# Patient Record
Sex: Female | Born: 1975 | Race: White | Hispanic: No | Marital: Married | Smoking: Never smoker
Health system: Southern US, Community
[De-identification: ages and names within clinical notes are randomized; demographics above are authoritative.]

## PROBLEM LIST (undated history)

## (undated) DIAGNOSIS — F32A Depression, unspecified: Secondary | ICD-10-CM

## (undated) DIAGNOSIS — G4700 Insomnia, unspecified: Secondary | ICD-10-CM

## (undated) DIAGNOSIS — Z8632 Personal history of gestational diabetes: Secondary | ICD-10-CM

## (undated) DIAGNOSIS — Z8781 Personal history of (healed) traumatic fracture: Secondary | ICD-10-CM

## (undated) DIAGNOSIS — N200 Calculus of kidney: Secondary | ICD-10-CM

## (undated) DIAGNOSIS — F419 Anxiety disorder, unspecified: Secondary | ICD-10-CM

## (undated) DIAGNOSIS — F329 Major depressive disorder, single episode, unspecified: Secondary | ICD-10-CM

## (undated) DIAGNOSIS — F909 Attention-deficit hyperactivity disorder, unspecified type: Secondary | ICD-10-CM

## (undated) DIAGNOSIS — O00109 Unspecified tubal pregnancy without intrauterine pregnancy: Secondary | ICD-10-CM

## (undated) DIAGNOSIS — E039 Hypothyroidism, unspecified: Secondary | ICD-10-CM

## (undated) DIAGNOSIS — R7302 Impaired glucose tolerance (oral): Secondary | ICD-10-CM

## (undated) HISTORY — DX: Personal history of (healed) traumatic fracture: Z87.81

## (undated) HISTORY — DX: Insomnia, unspecified: G47.00

## (undated) HISTORY — DX: Impaired glucose tolerance (oral): R73.02

## (undated) HISTORY — PX: ABLATION: SHX5711

## (undated) HISTORY — DX: Major depressive disorder, single episode, unspecified: F32.9

## (undated) HISTORY — DX: Hypothyroidism, unspecified: E03.9

## (undated) HISTORY — DX: Calculus of kidney: N20.0

## (undated) HISTORY — DX: Unspecified tubal pregnancy without intrauterine pregnancy: O00.109

## (undated) HISTORY — DX: Personal history of gestational diabetes: Z86.32

## (undated) HISTORY — PX: LAPAROSCOPIC CHOLECYSTECTOMY: SUR755

## (undated) HISTORY — DX: Attention-deficit hyperactivity disorder, unspecified type: F90.9

## (undated) HISTORY — DX: Anxiety disorder, unspecified: F41.9

## (undated) HISTORY — DX: Depression, unspecified: F32.A

---

## 1997-12-15 DIAGNOSIS — O00109 Unspecified tubal pregnancy without intrauterine pregnancy: Secondary | ICD-10-CM

## 1997-12-15 HISTORY — PX: OTHER SURGICAL HISTORY: SHX169

## 1997-12-15 HISTORY — DX: Unspecified tubal pregnancy without intrauterine pregnancy: O00.109

## 2004-01-26 ENCOUNTER — Ambulatory Visit (HOSPITAL_COMMUNITY): Admission: RE | Admit: 2004-01-26 | Discharge: 2004-01-26 | Payer: Self-pay | Admitting: Endocrinology

## 2005-08-26 ENCOUNTER — Ambulatory Visit: Payer: Self-pay | Admitting: Endocrinology

## 2005-09-19 ENCOUNTER — Ambulatory Visit: Payer: Self-pay | Admitting: Endocrinology

## 2007-03-10 ENCOUNTER — Ambulatory Visit: Payer: Self-pay | Admitting: Cardiology

## 2007-03-26 ENCOUNTER — Ambulatory Visit: Payer: Self-pay | Admitting: Cardiology

## 2007-04-08 ENCOUNTER — Ambulatory Visit: Payer: Self-pay | Admitting: Cardiology

## 2007-04-14 ENCOUNTER — Ambulatory Visit: Payer: Self-pay | Admitting: Cardiology

## 2007-05-19 ENCOUNTER — Ambulatory Visit: Payer: Self-pay | Admitting: Cardiology

## 2008-12-15 DIAGNOSIS — Z8632 Personal history of gestational diabetes: Secondary | ICD-10-CM

## 2008-12-15 HISTORY — DX: Personal history of gestational diabetes: Z86.32

## 2009-03-20 ENCOUNTER — Ambulatory Visit (HOSPITAL_COMMUNITY): Admission: RE | Admit: 2009-03-20 | Discharge: 2009-03-20 | Payer: Self-pay | Admitting: Pediatrics

## 2011-04-29 NOTE — Assessment & Plan Note (Signed)
[facility]                          [facility]   Karen, Arnold                       MRN:          [id number]  DATE:05/19/2007                            DOB:          January 20, 1976    REFERRING PHYSICIAN:  Fara Chute   [facility]   HISTORY OF PRESENT ILLNESS:  The patient is a 35 year old female with a  history of palpitations.  The patient also has been diagnosed with  hypertension.  The patient underwent a transesophageal echographic study  due to presumed abnormality on transthoracic echo.  She was found to  have lipomatous septal hypertrophy as a normal variant.  The patient  also has been ruled out for significant arrhythmias.  She does have  occasional PACs as documented by EKG.   States that overall, she is doing well.  Her blood pressure is under  better control with increasing her Cardizem.  However, the blood  pressure is still not optimal.   MEDICATIONS:  1. Zyrtec 10 mg p.o. daily.  2. Cardizem CD 240 mg p.o. daily.   PHYSICAL EXAMINATION:  VITAL SIGNS:  Blood pressure 128/87, heart rate  87, weight 275 pounds.  GENERAL:  Overweight white female with no apparent distress.  HEENT:  Pupils and eyes clear.  Conjunctivae clear.  NECK:  Supple.  Normal carotid upstroke.  No carotid bruits.  LUNGS:  Clear breath sounds bilaterally.  HEART:  Regular rate and rhythm.  Normal S1, S2.  No murmurs, rubs, or  gallops.  ABDOMEN:  Soft and nontender.  No rebound or guarding.  Normal bowel  sounds.  EXTREMITIES:  No cyanosis, clubbing, or edema.  NEURO:  The patient is alert and oriented, grossly nonfocal.   PROBLEM LIST:  1. Palpitations.      a.     Premature atrial contractions.      b.     Short PR interval with no definite evidence of Wolff-       Parkinson-White Syndrome.  2. Hypertension, poorly controlled.  3. Hypertensive heart disease.  4. Lipomatous septal hypertrophy.   PLAN:  1. The patient  needs better control of her blood pressure and I have      added hydrochlorothiazide.  2. Regarding the patient's palpitations, she has occasional PACs but      is otherwise      asymptomatic.  No additional therapy is warranted.  3. The patient will follow up with [place] in 6 months.     Learta Codding, MD,FACC  Electronically Signed    GED/MedQ  DD: 05/19/2007  DT: 05/19/2007  Job #: [id number]   cc:   Fara Chute

## 2011-05-02 NOTE — Assessment & Plan Note (Signed)
[facility]                          [facility]   Karen Arnold, Karen Arnold                       MRN:          [id number]  DATE:03/10/2007                            DOB:          11-24-1976    REASON FOR CONSULTATION:  Evaluation of cardiac arrhythmia.   HISTORY OF PRESENT ILLNESS:  The patient is a 35 year old female with a  history of longstanding palpitations.  The patient originally reported  palpitations 15 to 16 years ago.  Historically, the patient has been  taking Lanoxin, although she has not been taking this recently.  She  states that she saw several cardiologists in the past.  The most recent  cardiologist was 5 to 6 years ago.  She was seen in our [facility]  here in [place].  I do not have any records available, however, of that  encounter.  The patient states that she had an echocardiographic study  done at that time, which reportedly was within normal limits.  She now  reports 3 to 4 beats of irregular heart skipping and followed by a  pause, then she feels that the heart restarts with a forceful beat.  She feels that the sensation takes her breath away.  She denies having  any prolonged palpitations.  She denies any substernal chest pain or  syncope associated with the palpitations.  There are no definite  aggravating factors.  As a matter of fact, the patient denies any  tobacco, alcohol, or excessive caffeine intake.  She also denies any  drug use.  The patient has been referred for a questionable diagnosis of  WPW.  Review of her EKG does demonstrate a short PR interval, and there  is a small delta wave seen in lead 2.  Possibility of accessory pathway  is not excluded.  The patient reports normal exercise tolerance.  The  remainder of her cardiovascular review of systems is within normal  limits.   ALLERGIES:  NO KNOWN DRUG ALLERGIES.   SOCIAL HISTORY:  The patient is a full time Consulting civil engineer.   MEDICATIONS:  Zyrtec  10 mg p.o. daily.   FAMILY HISTORY:  Father had 1st heart attack at age 22.  Mother is age  80 and alive.  Father is age 6 now, and alive.   REVIEW OF SYSTEMS:  As per HPI.  No nausea or vomiting.  No fever or  chills.  No nausea, no hematochezia.  No dysuria or frequency.  No  orthopnea or PND.  No palpitations or syncope.   PHYSICAL EXAMINATION:  VITAL SIGNS:  Blood pressure is 143/93 mmHg.  On  repeat workup, it was 139/92 mm of mercury.  Heart rate is 102 beats per  minute.  On EKG it is 91 beats per minute.  GENERAL:  An overweight white female, in no apparent distress.  HEENT:  Pupils:  Eyes are __________.  NECK:  Supple.  No carotid upstroke.  No carotid bruits.  LUNGS:  Clear breath sounds bilaterally.  HEART:  Regular rate and rhythm.  Normal S1 and S2.  No murmurs, rubs,  or gallops.  ABDOMEN:  Soft and non-tender.  No rebound or guarding.  Good bowel  sounds.  EXTREMITY EXAM:  No cyanosis, clubbing or edema.  NEUROLOGIC:  Patient alert and oriented.  Grossly nonfocal.   PROBLEM LIST:  1. Palpitations.  2. Rule out WPW.  Short RP interval.  3. Rule out atrioventricular nodal reentrant tachycardia versus AV      reentry tachycardia.  4. Rule out sleep apnea.  5. Rule out hypertension heart disease (blood pressure poorly      controlled).   PLAN:  1. The patient will have repeat echocardiographic study to rule out      structural heart disease.  In light of her blood pressure and EKG,      the patient could have hypertensive heart disease.  2. Diagnosis WPW is not excluded.  The patient surely does have a      short PR interval on EKG, but the diagnosis is not definitive.  3. A CardioNet monitor will be applied to rule out any underlying      arrhythmias associated with number 2.  4. I started the patient on Cardizem CD 120 mg p.o. daily in light of      her elevated blood pressure.  The patient will follow up with me at      the end of monitoring.  We will decide  on any further testing at      that time.     Learta Codding, MD,FACC  Electronically Signed    GED/MedQ  DD: 03/10/2007  DT: 03/10/2007  Job #: [id number]   cc:   Rosanna Randy

## 2011-05-02 NOTE — Assessment & Plan Note (Signed)
[facility]                          [facility]   Karen Arnold, Karen Arnold                       MRN:          [id number]  DATE:04/08/2007                            DOB:          07-06-1976    HISTORY OF PRESENT ILLNESS:  The patient is a 35 year old female with a  longstanding history of palpitations.  She was seen in consultation on  March 10, 2007.  Review of the EKG did demonstrate a short PR interval  but diagnostically we were not certain whether this patient had WPW.  An  event monitor was applied.  The patient activated the monitor on several  occasions with palpitations, but each time sinus tachycardia was  demonstrated.  No significant arrhythmias were documented.  An  echocardiographic study was also done to rule out structural heart  disease and this demonstrated normal LV function and no significant  valvular lesions.  Dr. Diona Browner interpreted the study, did report a  small echodensity around the posterior mitral annulus at the level of  the basal intraatrial septum, the significance of this finding was  unclear.  In the interim, the patient has been started on Cardizem  mainly for blood pressure control but also in an attempt to control  palpitations which I feel are most likely secondary to premature atrial  contractions.  The patient stated that overall she has been doing well,  she has very rare palpitations and certainly has no specific discomfort  or complications with this.  Dr. Neita Carp has already further increased  the patient's Cardizem to 240 mg a day in order to control her blood  pressure.  The diastolic blood pressure in the office today is slightly  improved to 89 but certainly not optimal.   MEDICATIONS:  1. Zyrtec 10 mg p.o. daily.  2. Cardizem CD 240 mg p.o. daily.   PHYSICAL EXAMINATION:  VITAL SIGNS:  Blood pressure 135/89, heart rate  87 b.p.m., weight is 268 pounds.  NECK EXAM:  Normal carotid  upstroke, no carotid bruits.  LUNGS:  Clear.  HEART:  Regular rate and rhythm.  ABDOMEN:  Soft.  EXTREMITY EXAM:  No edema.   PROBLEM LIST:  1. Palpitations.      a.     Rule out premature atrial contractions.      b.     Rule out WPW with short PR interval (no evidence of reentry       arrhythmias with event monitor.  2. Hypertension, poorly controlled.  3. Rule out sleep apnea.  4. Rule out hypertensive heart disease.  5. Questionable echodensity on the posterior mitral annulus.   PLAN:  1. From a hypertensive perspective, the patient has improved.  Her      diastolic blood pressure is better but not optimal.  I would      anticipate that the patient will need the addition of      hydrochlorothiazide in near future but will defer this to Dr.      Neita Carp.  2. We will further evaluate the finding of echodensity at the level of  the posterior mitral annulus as interpreted on the echocardiogram      by Dr. Diona Browner.  I am not certain as to the significance, but the      patient wants to have this further evaluated and we will proceed      with a transesophageal echocardiographic study.  3. In terms of patient's palpitations, this appears to be relatively      benign and no significant arrhythmias have been documented.  The      patient is comfortable with just continuing her Cardizem for now      and not to change therapy.     Learta Codding, MD,FACC     GED/MedQ  DD: 04/08/2007  DT: 04/08/2007  Job #: [id number]   cc:   Fara Chute                             [facility]                          [facility]   NAME:Karen Arnold, Karen Arnold                       MRN:          [id number]  DATE:04/08/2007                            DOB:          10-Sep-1976    HISTORY OF PRESENT ILLNESS:  The patient is a 35 year old female with a  longstanding history of palpitations.  She was seen in consultation on  March 10, 2007.  Review of the EKG did demonstrate a short  PR interval  but diagnostically we were not certain whether this patient had WPW.  An  event monitor was applied.  The patient activated the monitor on several  occasions with palpitations, but each time sinus tachycardia was  demonstrated.  No significant arrhythmias were documented.  An  echocardiographic study was also done to rule out structural heart  disease and this demonstrated normal LV function and no significant  valvular lesions.  Dr. Diona Browner interpreted the study, did report a  small echodensity around the posterior mitral annulus at the level of  the basal intraatrial septum, the significance of this finding was  unclear.  In the interim, the patient has been started on Cardizem  mainly for blood pressure control but also in an attempt to control  palpitations which I feel are most likely secondary to premature atrial  contractions.  The patient stated that overall she has been doing well,  she has very rare palpitations and certainly has no specific discomfort  or complications with this.  Dr. Neita Carp has already further increased  the patient's Cardizem to 240 mg a day in order to control her blood  pressure.  The diastolic blood pressure in the office today is slightly  improved to 89 but certainly not optimal.   MEDICATIONS:  1. Zyrtec 10 mg p.o. daily.  2. Cardizem CD 240 mg p.o. daily.   PHYSICAL EXAMINATION:  VITAL SIGNS:  Blood pressure 135/89, heart rate  87 b.p.m., weight is 268 pounds.  NECK EXAM:  Normal carotid upstroke, no carotid bruits.  LUNGS:  Clear.  HEART:  Regular rate and rhythm.  ABDOMEN:  Soft.  EXTREMITY EXAM:  No  edema.   PROBLEM LIST:  1. Palpitations.      a.     Rule out premature atrial contractions.      b.     Rule out WPW with short PR interval (no evidence of reentry       arrhythmias with event monitor.  2. Hypertension, poorly controlled.  3. Rule out sleep apnea.  4. Rule out hypertensive heart disease.  5. Questionable  echodensity on the posterior mitral annulus.   PLAN:  1. From a hypertensive perspective, the patient has improved.  Her      diastolic blood pressure is better but not optimal.  I would      anticipate that the patient will need the addition of      hydrochlorothiazide in near future but will defer this to Dr.      Neita Carp.  2. We will further evaluate the finding of echodensity at the level of      the posterior mitral annulus as interpreted on the echocardiogram

## 2011-07-16 DIAGNOSIS — Z8781 Personal history of (healed) traumatic fracture: Secondary | ICD-10-CM

## 2011-07-16 HISTORY — DX: Personal history of (healed) traumatic fracture: Z87.81

## 2011-08-06 ENCOUNTER — Other Ambulatory Visit (HOSPITAL_COMMUNITY): Payer: Self-pay | Admitting: Pediatrics

## 2011-08-06 ENCOUNTER — Ambulatory Visit (HOSPITAL_COMMUNITY)
Admission: RE | Admit: 2011-08-06 | Discharge: 2011-08-06 | Disposition: A | Discharge status: Home / Self Care | Payer: 59 | Source: Ambulatory Visit | Attending: Pediatrics | Admitting: Pediatrics

## 2011-08-06 DIAGNOSIS — R52 Pain, unspecified: Secondary | ICD-10-CM

## 2011-08-06 DIAGNOSIS — S8263XB Displaced fracture of lateral malleolus of unspecified fibula, initial encounter for open fracture type I or II: Secondary | ICD-10-CM | POA: Insufficient documentation

## 2011-08-06 DIAGNOSIS — M25579 Pain in unspecified ankle and joints of unspecified foot: Secondary | ICD-10-CM | POA: Insufficient documentation

## 2011-08-06 DIAGNOSIS — X500XXA Overexertion from strenuous movement or load, initial encounter: Secondary | ICD-10-CM | POA: Insufficient documentation

## 2011-12-11 ENCOUNTER — Other Ambulatory Visit (HOSPITAL_COMMUNITY): Payer: Self-pay | Admitting: "Endocrinology

## 2011-12-11 DIAGNOSIS — E049 Nontoxic goiter, unspecified: Secondary | ICD-10-CM

## 2011-12-12 ENCOUNTER — Ambulatory Visit (HOSPITAL_COMMUNITY)
Admission: RE | Admit: 2011-12-12 | Discharge: 2011-12-12 | Disposition: A | Discharge status: Home / Self Care | Payer: 59 | Source: Ambulatory Visit | Attending: "Endocrinology | Admitting: "Endocrinology

## 2011-12-12 DIAGNOSIS — E042 Nontoxic multinodular goiter: Secondary | ICD-10-CM | POA: Insufficient documentation

## 2011-12-12 DIAGNOSIS — E049 Nontoxic goiter, unspecified: Secondary | ICD-10-CM

## 2012-01-23 ENCOUNTER — Other Ambulatory Visit (HOSPITAL_COMMUNITY): Payer: Self-pay | Admitting: Pediatrics

## 2012-01-23 DIAGNOSIS — R52 Pain, unspecified: Secondary | ICD-10-CM

## 2012-01-23 DIAGNOSIS — N6320 Unspecified lump in the left breast, unspecified quadrant: Secondary | ICD-10-CM

## 2012-01-23 DIAGNOSIS — N644 Mastodynia: Secondary | ICD-10-CM

## 2012-02-04 ENCOUNTER — Ambulatory Visit (HOSPITAL_COMMUNITY): Admission: RE | Admit: 2012-02-04 | Payer: 59 | Source: Ambulatory Visit

## 2013-01-06 ENCOUNTER — Ambulatory Visit (INDEPENDENT_AMBULATORY_CARE_PROVIDER_SITE_OTHER): Payer: 59 | Admitting: Family Medicine

## 2013-01-06 ENCOUNTER — Encounter: Payer: Self-pay | Admitting: Family Medicine

## 2013-01-06 VITALS — BP 141/71 | HR 92 | Temp 98.0°F | Ht 70.0 in | Wt 282.0 lb

## 2013-01-06 DIAGNOSIS — F909 Attention-deficit hyperactivity disorder, unspecified type: Secondary | ICD-10-CM | POA: Insufficient documentation

## 2013-01-06 DIAGNOSIS — E039 Hypothyroidism, unspecified: Secondary | ICD-10-CM

## 2013-01-06 DIAGNOSIS — F411 Generalized anxiety disorder: Secondary | ICD-10-CM

## 2013-01-06 DIAGNOSIS — F419 Anxiety disorder, unspecified: Secondary | ICD-10-CM

## 2013-01-06 MED ORDER — LEVOTHYROXINE SODIUM 112 MCG PO TABS: 224.0000 ug | ORAL_TABLET | Freq: Every day | ORAL | Status: DC

## 2013-01-06 MED ORDER — ALPRAZOLAM 1 MG PO TABS: 1.0000 mg | ORAL_TABLET | Freq: Four times a day (QID) | ORAL | Status: DC | PRN

## 2013-01-06 MED ORDER — AMPHETAMINE-DEXTROAMPHETAMINE 20 MG PO TABS: 20.0000 mg | ORAL_TABLET | Freq: Two times a day (BID) | ORAL | Status: DC

## 2013-01-06 NOTE — Progress Notes (Addendum)
Office Note 01/06/2013  CC:  Chief Complaint  Patient presents with  . Establish Care    refill meds    HPI:  Karen Arnold is a 37 y.o. White female who is here to establish care. Patient's most recent primary MD: Dr. Milford Cage at [facility] in [place], [place].  GYN is [facility] in [place]. Last pap 2011. Old records were not reviewed prior to or during today's visit.  Out of thyroid med x 1 mo, last TSH check was about 6 mo ago. Needs adderall RF--says she is doing great with adult adhd sx's on this med. She uses xanax usually for sleep but also sometimes a dose in the daytime.   Past Medical History  Diagnosis Date  . Adult ADHD   . Anxiety   . Hypothyroidism   . Depression   . Insomnia   . Ectopic pregnancy, tubal 1999    left fallopian tube resected    Past Surgical History  Procedure Date  . Cesarean section   . Laparoscopic cholecystectomy early 2000s  . Ablation     thyroid  . Ectopic 1999    left fallopian tube resected for ectopic preg     Family History  Problem Relation Age of Onset  . Heart disease Mother   . Hypertension Mother   . Heart disease Father   . Hypertension Father   . Cancer Maternal Grandmother     breast    History   Social History  . Marital Status: Married    Spouse Name: N/A    Number of Children: N/A  . Years of Education: N/A   Occupational History  . Not on file.   Social History Main Topics  . Smoking status: Never Smoker   . Smokeless tobacco: Never Used  . Alcohol Use: No  . Drug Use: No  . Sexually Active: Yes -- Female partner(s)    Birth Control/ Protection: IUD   Other Topics Concern  . 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 ALPRAZolam (XANAX) 1 MG tablet Take 1 tablet (1 mg total) by mouth every 6 (six) hours as needed.  135 tablet  0  .  amphetamine-dextroamphetamine (ADDERALL) 20 MG tablet Take 1 tablet (20 mg total) by mouth 2 (two) times daily.  180 tablet  0  . levothyroxine (SYNTHROID, LEVOTHROID) 112 MCG tablet Take 2 tablets (224 mcg total) by mouth daily.  180 tablet  1  . [DISCONTINUED] ALPRAZolam (XANAX) 1 MG tablet Take 1 mg by mouth every 6 (six) hours as needed.      . [DISCONTINUED] amphetamine-dextroamphetamine (ADDERALL) 20 MG tablet Take 20 mg by mouth 2 (two) times daily.      . [DISCONTINUED] levothyroxine (SYNTHROID, LEVOTHROID) 112 MCG tablet Take 224 mcg by mouth daily.        No Known Allergies  ROS Review of Systems  Constitutional: Negative for fever and fatigue.  HENT: Negative for congestion and sore throat.   Eyes: Negative for visual disturbance.  Respiratory: Negative for cough.   Cardiovascular: Negative for chest pain.  Gastrointestinal: Negative for nausea and abdominal pain.  Genitourinary: Negative for dysuria.  Musculoskeletal: Positive for back pain (mild, low back central region when sitting, resolves with standing and stretching). Negative for joint swelling.  Skin: Negative for rash.  Neurological: Negative for weakness and headaches.  Hematological: Negative for  adenopathy.  Psychiatric/Behavioral: Positive for decreased concentration. The patient is nervous/anxious.     PE; Blood pressure 141/71, pulse 92, temperature 98 F (36.7 C), temperature source Temporal, height 5\' 10"  (1.778 m), weight 282 lb (127.914 kg), SpO2 100.00%. Gen: Alert, well appearing, obese white female in NAD.  Patient is oriented to person, place, time, and situation. AFFECT: pleasant, lucid thought and speech. CV: RRR, no m/r/g.   LUNGS: CTA bilat, nonlabored resps, good aeration in all lung fields. EXT: no clubbing, cyanosis, or edema.   Pertinent labs:  None today  ASSESSMENT AND PLAN:   New pt: obtain old records.  Hypothyroidism Restart synthroid 112 mcg qd x 7-10d then increase to her  previous maintenance dose of 224 mcg qd.  Recheck TSH at next f/u in 45mo.  Anxiety Pretty stable.  Consistently takes one xanax in evening to help with sleep but occasionally needs one in daytime for anxiety/stress/feeling overwhelmed. RF'd rx for 90 day supply today.  Adult ADHD Stable. 90 day rx for her adderall was done today.   An After Visit Summary was printed and given to the patient.  Return in about 3 months (around 04/06/2013) for morning appt for CPE/labs--needs to be fasting.  ADDENDUM 01/10/13 after reviewing old records: needs HbA1c at her f/u appt (hx of impaired gluc tol/GDM with pregnancy 2010.-PM

## 2013-01-06 NOTE — Assessment & Plan Note (Signed)
Stable. 90 day rx for her adderall was done today.

## 2013-01-06 NOTE — Assessment & Plan Note (Signed)
Pretty stable.  Consistently takes one xanax in evening to help with sleep but occasionally needs one in daytime for anxiety/stress/feeling overwhelmed. RF'd rx for 90 day supply today.

## 2013-01-06 NOTE — Assessment & Plan Note (Signed)
Restart synthroid 112 mcg qd x 7-10d then increase to her previous maintenance dose of 224 mcg qd.  Recheck TSH at next f/u in 38mo.

## 2013-01-10 ENCOUNTER — Encounter: Payer: Self-pay | Admitting: Family Medicine

## 2013-01-29 ENCOUNTER — Other Ambulatory Visit: Payer: Self-pay

## 2013-03-28 ENCOUNTER — Encounter: Payer: Self-pay | Admitting: Family Medicine

## 2013-03-29 NOTE — Telephone Encounter (Signed)
Pt last seen 12/2012.  Has CPE appt 04/06/13.  Would like CPE labs, A1C and TSH for hypothyroid prior to visit.  If OK I will enter order and fax to [facility].  Thanks.

## 2013-04-04 ENCOUNTER — Encounter: Payer: Self-pay | Admitting: Family Medicine

## 2013-04-04 NOTE — Telephone Encounter (Signed)
Request RE: lab orders/SLS

## 2013-04-05 ENCOUNTER — Other Ambulatory Visit: Payer: Self-pay | Admitting: Family Medicine

## 2013-04-05 DIAGNOSIS — Z0000 Encounter for general adult medical examination without abnormal findings: Secondary | ICD-10-CM

## 2013-04-05 DIAGNOSIS — E039 Hypothyroidism, unspecified: Secondary | ICD-10-CM

## 2013-04-06 ENCOUNTER — Ambulatory Visit (INDEPENDENT_AMBULATORY_CARE_PROVIDER_SITE_OTHER): Payer: 59 | Admitting: Family Medicine

## 2013-04-06 ENCOUNTER — Encounter: Payer: Self-pay | Admitting: Family Medicine

## 2013-04-06 VITALS — BP 128/85 | HR 113 | Temp 98.1°F | Resp 18 | Ht 68.0 in | Wt 273.2 lb

## 2013-04-06 DIAGNOSIS — F419 Anxiety disorder, unspecified: Secondary | ICD-10-CM

## 2013-04-06 DIAGNOSIS — R7301 Impaired fasting glucose: Secondary | ICD-10-CM

## 2013-04-06 DIAGNOSIS — Z0000 Encounter for general adult medical examination without abnormal findings: Secondary | ICD-10-CM | POA: Insufficient documentation

## 2013-04-06 DIAGNOSIS — F411 Generalized anxiety disorder: Secondary | ICD-10-CM

## 2013-04-06 DIAGNOSIS — F909 Attention-deficit hyperactivity disorder, unspecified type: Secondary | ICD-10-CM

## 2013-04-06 DIAGNOSIS — E039 Hypothyroidism, unspecified: Secondary | ICD-10-CM

## 2013-04-06 MED ORDER — AMPHETAMINE-DEXTROAMPHETAMINE 20 MG PO TABS: 20.0000 mg | ORAL_TABLET | Freq: Two times a day (BID) | ORAL | Status: DC

## 2013-04-06 MED ORDER — CITALOPRAM HYDROBROMIDE 20 MG PO TABS: 20.0000 mg | ORAL_TABLET | Freq: Every day | ORAL | Status: DC

## 2013-04-06 NOTE — Progress Notes (Signed)
Office Note 04/06/2013  CC:  Chief Complaint  Patient presents with  . Annual Exam    HPI:  Karen Arnold is a 37 y.o. White female who is here for CPE. Also describes feeling chronically fatigued.  Says adderall and synthroid have helped this but she still feels heavy weight of stress and has poor energy level.  Admits that anxiety affecting her more last few months in particular, says she may be moving out on her husband, also lots of work stress.  She has intermittent breaks in her sleep, esp around 2 AM.  Says HR has always been 100-120 range, even prior to getting on current meds. Says she takes her xanax only occasionally, mostly for feeling overwhelmed with stress or to help with sleep.  Denies feeling sad or depressed. She is active with her children and their extracurricular activities.  Says she is too busy to exercise but does occasionally walk for exercise, wants to try doing this more.  She is trying to eat a more prudent diet lately. She is not fasting today so we'll get labs when fasting ASAP.  Records from her prior PCP were reviewed but it unfortunately had no thyroid labs.  Per pt report her last TSH about 6-8 mo ago was wnl.  Past Medical History  Diagnosis Date  . Adult ADHD   . Anxiety   . Hypothyroidism   . Depression   . Insomnia   . Ectopic pregnancy, tubal 1999    left fallopian tube resected  . History of ankle fracture 07/2011    left (nondisplaced fx of tip of lat malleolus  . Impaired glucose tolerance   . History of gestational diabetes mellitus 2010    Past Surgical History  Procedure Laterality Date  . Cesarean section    . Laparoscopic cholecystectomy  early 2000s  . Ablation      thyroid  . Ectopic  1999    left fallopian tube resected for ectopic preg     Family History  Problem Relation Age of Onset  . Heart disease Mother   . Hypertension Mother   . Heart disease Father   . Hypertension Father   . Cancer Maternal Grandmother      breast    History   Social History  . Marital Status: Married    Spouse Name: N/A    Number of Children: N/A  . Years of Education: N/A   Occupational History  . Not on file.   Social History Main Topics  . Smoking status: Never Smoker   . Smokeless tobacco: Never Used  . Alcohol Use: No  . Drug Use: No  . Sexually Active: Yes -- Female partner(s)    Birth Control/ Protection: IUD   Other Topics Concern  . Not on file   Social History Narrative   Married, 4 daughters.   Occupation: CMA   No tob/alc/drugs          Outpatient Prescriptions Prior to Visit  Medication Sig Dispense Refill  . ALPRAZolam (XANAX) 1 MG tablet Take 1 tablet (1 mg total) by mouth every 6 (six) hours as needed.  135 tablet  0  . levothyroxine (SYNTHROID, LEVOTHROID) 112 MCG tablet Take 2 tablets (224 mcg total) by mouth daily.  180 tablet  1  . amphetamine-dextroamphetamine (ADDERALL) 20 MG tablet Take 1 tablet (20 mg total) by mouth 2 (two) times daily.  180 tablet  0   No facility-administered medications prior to visit.    No  Known Allergies  ROS Review of Systems  Constitutional: Positive for fatigue. Negative for fever, chills and appetite change.  HENT: Negative for ear pain, congestion, sore throat, neck stiffness and dental problem.   Eyes: Negative for discharge, redness and visual disturbance.  Respiratory: Negative for cough, chest tightness, shortness of breath and wheezing.   Cardiovascular: Negative for chest pain, palpitations and leg swelling.  Gastrointestinal: Negative for nausea, vomiting, abdominal pain, diarrhea and blood in stool.  Endocrine: Negative for polydipsia and polyuria.  Genitourinary: Negative for dysuria, urgency, frequency, hematuria, flank pain and difficulty urinating.  Musculoskeletal: Negative for myalgias, back pain, joint swelling and arthralgias.  Skin: Negative for pallor and rash.  Neurological: Negative for dizziness, speech difficulty,  weakness and headaches.  Hematological: Negative for adenopathy. Does not bruise/bleed easily.  Psychiatric/Behavioral: Positive for sleep disturbance and decreased concentration. Negative for suicidal ideas, confusion and dysphoric mood. The patient is nervous/anxious.     PE; Blood pressure 128/85, pulse 113, temperature 98.1 F (36.7 C), temperature source Oral, resp. rate 18, height 5\' 8"  (1.727 m), weight 273 lb 4 oz (123.945 kg), SpO2 97.00%. Gen: Alert, well appearing, obese white female in NAD.  Patient is oriented to person, place, time, and situation. AFFECT: pleasant, lucid thought and speech. ENT: Ears: EACs clear, normal epithelium.  TMs with good light reflex and landmarks bilaterally.  Eyes: no injection, icteris, swelling, or exudate.  EOMI, PERRLA. Nose: no drainage or turbinate edema/swelling.  No injection or focal lesion.  Mouth: lips without lesion/swelling.  Oral mucosa pink and moist.  Dentition intact and without obvious caries or gingival swelling.  Oropharynx without erythema, exudate, or swelling.  Neck: supple/nontender.  No LAD, mass, or TM.  Carotid pulses 2+ bilaterally, without bruits. CV: RRR, no m/r/g.   LUNGS: CTA bilat, nonlabored resps, good aeration in all lung fields. ABD: soft, NT, ND, BS normal.  No hepatospenomegaly or mass.  No bruits. EXT: no clubbing, cyanosis, or edema.  Musculoskeletal: no joint swelling, erythema, warmth, or tenderness.  ROM of all joints intact. Skin - no sores or suspicious lesions or rashes or color changes  Pertinent labs:  None today  ASSESSMENT AND PLAN:   Health maintenance examination Reviewed age and gender appropriate health maintenance issues (prudent diet, regular exercise, health risks of tobacco and excessive alcohol, use of seatbelts, fire alarms in home, use of sunscreen).  Also reviewed age and gender appropriate health screening as well as vaccine recommendations. Reviewed calcium and vitamin D  recommendations with her. She reports that her Tdap was done about 5 yrs ago. She is due to set up a yearly exam with her GYN. Mammograms: will start at age 84 (pt reports her GM had breast cancer, and pt had mammogram a couple of years ago that was normal). Fasting labs ordered--she'll get them ASAP when fasting.  Anxiety Start citalopram 20mg  qd.  Therapeutic expectations and side effect profile of medication discussed today.  Patient's questions answered. I think this is playing a big role in her daily fatigue.  Adult ADHD Stable on current adderall dosing. RF's given today for the next 3 months with appropriate fill on/after dating on rx's.    An After Visit Summary was printed and given to the patient.  FOLLOW UP:  Return in about 4 weeks (around 05/04/2013) for f/u anxiety.

## 2013-04-06 NOTE — Assessment & Plan Note (Signed)
Reviewed age and gender appropriate health maintenance issues (prudent diet, regular exercise, health risks of tobacco and excessive alcohol, use of seatbelts, fire alarms in home, use of sunscreen).  Also reviewed age and gender appropriate health screening as well as vaccine recommendations. Reviewed calcium and vitamin D recommendations with her. She reports that her Tdap was done about 5 yrs ago. She is due to set up a yearly exam with her GYN. Mammograms: will start at age 39 (pt reports her GM had breast cancer, and pt had mammogram a couple of years ago that was normal). Fasting labs ordered--she'll get them ASAP when fasting.

## 2013-04-06 NOTE — Assessment & Plan Note (Signed)
Start citalopram 20mg  qd.  Therapeutic expectations and side effect profile of medication discussed today.  Patient's questions answered. I think this is playing a big role in her daily fatigue.

## 2013-04-06 NOTE — Assessment & Plan Note (Signed)
Stable on current adderall dosing. RF's given today for the next 3 months with appropriate fill on/after dating on rx's.

## 2013-04-07 ENCOUNTER — Other Ambulatory Visit: Payer: Self-pay | Admitting: Pediatrics

## 2013-04-07 ENCOUNTER — Other Ambulatory Visit: Payer: Self-pay | Admitting: Family Medicine

## 2013-04-07 ENCOUNTER — Other Ambulatory Visit: Payer: Self-pay

## 2013-04-07 DIAGNOSIS — E039 Hypothyroidism, unspecified: Secondary | ICD-10-CM

## 2013-04-07 DIAGNOSIS — Z0000 Encounter for general adult medical examination without abnormal findings: Secondary | ICD-10-CM

## 2013-04-07 DIAGNOSIS — R7301 Impaired fasting glucose: Secondary | ICD-10-CM

## 2013-04-08 ENCOUNTER — Encounter: Payer: Self-pay | Admitting: Family Medicine

## 2013-04-08 ENCOUNTER — Other Ambulatory Visit: Payer: Self-pay | Admitting: Family Medicine

## 2013-04-08 LAB — CBC WITH DIFFERENTIAL/PLATELET
Basophils Relative: 0 % (ref 0–1)
Lymphocytes Relative: 26 % (ref 12–46)
Lymphs Abs: 3.1 10*3/uL (ref 0.7–4.0)
MCH: 28.5 pg (ref 26.0–34.0)
MCHC: 34.6 g/dL (ref 30.0–36.0)
MCV: 82.5 fL (ref 78.0–100.0)
Monocytes Absolute: 0.6 10*3/uL (ref 0.1–1.0)
Monocytes Relative: 5 % (ref 3–12)
Neutro Abs: 8 10*3/uL — ABNORMAL HIGH (ref 1.7–7.7)
Neutrophils Relative %: 67 % (ref 43–77)
Platelets: 430 10*3/uL — ABNORMAL HIGH (ref 150–400)
RDW: 13.3 % (ref 11.5–15.5)

## 2013-04-08 LAB — COMPREHENSIVE METABOLIC PANEL
Albumin: 4 g/dL (ref 3.5–5.2)
Alkaline Phosphatase: 113 U/L (ref 39–117)
BUN: 13 mg/dL (ref 6–23)
Calcium: 8.9 mg/dL (ref 8.4–10.5)
Chloride: 104 mEq/L (ref 96–112)
Glucose, Bld: 107 mg/dL — ABNORMAL HIGH (ref 70–99)
Potassium: 4.3 mEq/L (ref 3.5–5.3)
Sodium: 137 mEq/L (ref 135–145)
Total Protein: 6.8 g/dL (ref 6.0–8.3)

## 2013-04-08 LAB — TSH: TSH: 0.062 u[IU]/mL — ABNORMAL LOW (ref 0.350–4.500)

## 2013-04-08 LAB — LIPID PANEL
LDL Cholesterol: 81 mg/dL (ref 0–99)
Triglycerides: 90 mg/dL (ref <150)

## 2013-04-08 LAB — HEMOGLOBIN A1C: Mean Plasma Glucose: 114 mg/dL (ref <117)

## 2013-04-08 MED ORDER — LEVOTHYROXINE SODIUM 200 MCG PO TABS: 200.0000 ug | ORAL_TABLET | Freq: Every day | ORAL | Status: DC

## 2013-04-08 NOTE — Progress Notes (Signed)
This is a correction to my result note for this patient: I said in the result note to decrease dose from 112 to 100 mcg daily, but then I realized she has been taking TWO of the 112 mcg tabs per day, so I actually want her to decrease dose down to 200 mcg per day--I sent rx in to pharmacy.  Still recheck TSH in 6-8 wks.-thx

## 2013-04-08 NOTE — Progress Notes (Signed)
Message copied to results note/SLS

## 2013-04-27 ENCOUNTER — Encounter: Payer: Self-pay | Admitting: Family Medicine

## 2013-04-28 ENCOUNTER — Other Ambulatory Visit: Payer: Self-pay | Admitting: Family Medicine

## 2013-04-28 ENCOUNTER — Other Ambulatory Visit: Payer: Self-pay | Admitting: Pediatrics

## 2013-04-28 DIAGNOSIS — D72829 Elevated white blood cell count, unspecified: Secondary | ICD-10-CM

## 2013-04-28 NOTE — Progress Notes (Signed)
Orders only

## 2013-05-04 ENCOUNTER — Ambulatory Visit: Payer: 59 | Admitting: Family Medicine

## 2013-07-01 ENCOUNTER — Other Ambulatory Visit: Payer: Self-pay | Admitting: Family Medicine

## 2013-07-01 ENCOUNTER — Encounter: Payer: Self-pay | Admitting: Family Medicine

## 2013-07-01 ENCOUNTER — Other Ambulatory Visit: Payer: Self-pay | Admitting: *Deleted

## 2013-07-01 DIAGNOSIS — D473 Essential (hemorrhagic) thrombocythemia: Secondary | ICD-10-CM

## 2013-07-01 DIAGNOSIS — D72829 Elevated white blood cell count, unspecified: Secondary | ICD-10-CM | POA: Insufficient documentation

## 2013-07-01 MED ORDER — LEVOTHYROXINE SODIUM 200 MCG PO TABS: 200.0000 ug | ORAL_TABLET | Freq: Every day | ORAL | Status: DC

## 2013-07-01 NOTE — Telephone Encounter (Signed)
Please advise 

## 2013-07-01 NOTE — Telephone Encounter (Signed)
I need a refill on my synthroid. And I know that I need to have my TSH checked.. I never got my repeat CBC done a few months ago. Can you put in an order for a TSH and CBC and fax it over to [phone]. We had problems getting it to release on our end.  if you have an questions please call me at work [phone] ext [phone].

## 2013-07-05 ENCOUNTER — Other Ambulatory Visit: Payer: Self-pay | Admitting: Family Medicine

## 2013-07-05 ENCOUNTER — Encounter: Payer: Self-pay | Admitting: Family Medicine

## 2013-07-05 LAB — CBC WITH DIFFERENTIAL/PLATELET
Basophils Relative: 0 % (ref 0–1)
Eosinophils Absolute: 0.2 10*3/uL (ref 0.0–0.7)
HCT: 39.5 % (ref 36.0–46.0)
Hemoglobin: 13.4 g/dL (ref 12.0–15.0)
MCH: 28.3 pg (ref 26.0–34.0)
MCHC: 33.9 g/dL (ref 30.0–36.0)
Monocytes Absolute: 0.6 10*3/uL (ref 0.1–1.0)
Monocytes Relative: 6 % (ref 3–12)
Neutro Abs: 7.2 10*3/uL (ref 1.7–7.7)

## 2013-07-05 MED ORDER — LEVOTHYROXINE SODIUM 175 MCG PO TABS: 175.0000 ug | ORAL_TABLET | Freq: Every day | ORAL | Status: DC

## 2013-07-11 ENCOUNTER — Other Ambulatory Visit: Payer: Self-pay | Admitting: Family Medicine

## 2013-07-11 ENCOUNTER — Telehealth: Payer: Self-pay | Admitting: Family Medicine

## 2013-07-11 MED ORDER — AMPHETAMINE-DEXTROAMPHETAMINE 20 MG PO TABS: 20.0000 mg | ORAL_TABLET | Freq: Two times a day (BID) | ORAL | Status: DC

## 2013-07-11 NOTE — Telephone Encounter (Signed)
Pt requesting refill on adderall.  Patient last seen 04/06/13.  Rx given to patient with 3 refills.  Was supposed to follow up in 4 weeks.  No upcoming appt.  Please advise refill.

## 2013-07-11 NOTE — Telephone Encounter (Signed)
Pls notify pt that she can come by and pick up her adderall rx or we can mail it to her home address by [place] postal [employer]. CANNOT do interoffice mail.-thx

## 2013-07-12 NOTE — Telephone Encounter (Signed)
Left message for patient letting her know that her rx is at the front desk for pick up.

## 2013-08-04 ENCOUNTER — Ambulatory Visit (INDEPENDENT_AMBULATORY_CARE_PROVIDER_SITE_OTHER): Payer: 59 | Admitting: Family Medicine

## 2013-08-04 ENCOUNTER — Encounter: Payer: Self-pay | Admitting: Family Medicine

## 2013-08-04 VITALS — BP 120/80 | HR 99 | Temp 98.4°F | Ht 68.0 in | Wt 251.5 lb

## 2013-08-04 DIAGNOSIS — F909 Attention-deficit hyperactivity disorder, unspecified type: Secondary | ICD-10-CM

## 2013-08-04 DIAGNOSIS — L708 Other acne: Secondary | ICD-10-CM

## 2013-08-04 DIAGNOSIS — L709 Acne, unspecified: Secondary | ICD-10-CM | POA: Insufficient documentation

## 2013-08-04 DIAGNOSIS — D72829 Elevated white blood cell count, unspecified: Secondary | ICD-10-CM

## 2013-08-04 DIAGNOSIS — E039 Hypothyroidism, unspecified: Secondary | ICD-10-CM

## 2013-08-04 DIAGNOSIS — D473 Essential (hemorrhagic) thrombocythemia: Secondary | ICD-10-CM

## 2013-08-04 MED ORDER — BENZOYL PEROXIDE 10 % EX LOTN: TOPICAL_LOTION | CUTANEOUS | Status: DC

## 2013-08-04 MED ORDER — AMPHETAMINE-DEXTROAMPHETAMINE 20 MG PO TABS: 20.0000 mg | ORAL_TABLET | Freq: Two times a day (BID) | ORAL | Status: DC

## 2013-08-04 NOTE — Assessment & Plan Note (Signed)
TSHs have been supratherapeutic and we've been downtitrating synthroid dose--has been on qd x 4 wks.  Recheck TSH in 1 mo.

## 2013-08-04 NOTE — Assessment & Plan Note (Signed)
Problem stable.  Continue current medications and diet appropriate for this condition.  We have reviewed our general long term plan for this problem and also reviewed symptoms and signs that should prompt the patient to call or return to the office.  Pt last filled her adderall rx 07/16/13. Rx's for Sept, Oct, Nov, Dec given today with appropriate fill on/after dating on rx's.

## 2013-08-04 NOTE — Assessment & Plan Note (Signed)
Benzoyl peroxide 10% cream rx'd to apply qd-bid to affected areas prn.  Therapeutic expectations and side effect profile of medication discussed today.  Patient's questions answered.

## 2013-08-04 NOTE — Progress Notes (Signed)
OFFICE NOTE  08/04/2013  CC: No chief complaint on file.    HPI: Patient is a 37 y.o. Caucasian female who is here for 79mo f/u adult ADD, anxiety, and hypothyroidism follow up. Asks for med RF. Not taking citalopram --"I don't need that".  Takes xanax rarely. Has been on 175 synthroid for the last 4 wks---TSH has been low and we have done two down-titrations. She says she feels good.  Staying active and trying to eat sensibly.  Has lost about 14 lbs purposefully since last f/u visit. Asks for acne med: has used witch hazel and OTC strength benzoyl peroxide and it has not helped.  Pertinent PMH:  Past Medical History  Diagnosis Date  . Adult ADHD   . Anxiety   . Hypothyroidism   . Depression   . Insomnia   . Ectopic pregnancy, tubal 1999    left fallopian tube resected  . History of ankle fracture 07/2011    left (nondisplaced fx of tip of lat malleolus  . Impaired glucose tolerance   . History of gestational diabetes mellitus 2010   Past surgical, social, and family history reviewed and no changes noted since last office visit except she has separated from her husband and they plan on divorcing.   MEDS:  Outpatient Prescriptions Prior to Visit  Medication Sig Dispense Refill  . citalopram (CELEXA) 20 MG tablet Take 1 tablet (20 mg total) by mouth daily.  30 tablet  1  . levothyroxine (SYNTHROID, LEVOTHROID) 175 MCG tablet Take 1 tablet (175 mcg total) by mouth daily before breakfast.  30 tablet  2  . ALPRAZolam (XANAX) 1 MG tablet Take 1 tablet (1 mg total) by mouth every 6 (six) hours as needed.  135 tablet  0  . amphetamine-dextroamphetamine (ADDERALL) 20 MG tablet Take 1 tablet (20 mg total) by mouth 2 (two) times daily.  60 tablet  0   No facility-administered medications prior to visit.    PE: Blood pressure 120/80, pulse 99, temperature 98.4 F (36.9 C), temperature source Temporal, height 5\' 8"  (1.727 m), weight 251 lb 8 oz (114.08 kg), SpO2 97.00%. Wt Readings  from Last 2 Encounters:  08/04/13 251 lb 8 oz (114.08 kg)  04/06/13 273 lb 4 oz (123.945 kg)    Gen: alert, oriented x 4, affect pleasant.  Lucid thinking and conversation noted. HEENT: PERRLA, EOMI.  Face, upper chest, shoulders all with a smattering of some pinkish small comedonal sin lesions. Neck: no LAD, mass, or thyromegaly. CV: RRR, no m/r/g LUNGS: CTA bilat, nonlabored. NEURO: no tremor or tics noted on observation.  Coordination intact. CN 2-12 grossly intact bilaterally, strength 5/5 in all extremeties.  No ataxia.   IMPRESSION AND PLAN:  Adult ADHD Problem stable.  Continue current medications and diet appropriate for this condition.  We have reviewed our general long term plan for this problem and also reviewed symptoms and signs that should prompt the patient to call or return to the office.  Pt last filled her adderall rx 07/16/13. Rx's for Sept, Oct, Nov, Dec given today with appropriate fill on/after dating on rx's.  Hypothyroidism TSHs have been supratherapeutic and we've been downtitrating synthroid dose--has been on qd x 4 wks.  Recheck TSH in 1 mo.  Leukocytosis, unspecified Mild, plus platelets were a bit elevated at one point. Most recent check showed platelets back in normal range but WBC still slightly high. Will recheck CBC when I recheck TSH in 1 mo.  Acne Benzoyl peroxide 10%  cream rx'd to apply qd-bid to affected areas prn.  Therapeutic expectations and side effect profile of medication discussed today.  Patient's questions answered.   An After Visit Summary was printed and given to the patient.  FOLLOW UP: 29mo

## 2013-08-04 NOTE — Assessment & Plan Note (Signed)
Mild, plus platelets were a bit elevated at one point. Most recent check showed platelets back in normal range but WBC still slightly high. Will recheck CBC when I recheck TSH in 1 mo.

## 2013-10-20 ENCOUNTER — Other Ambulatory Visit: Payer: Self-pay

## 2013-11-04 ENCOUNTER — Encounter: Payer: Self-pay | Admitting: Family Medicine

## 2013-11-07 ENCOUNTER — Other Ambulatory Visit: Payer: Self-pay | Admitting: Family Medicine

## 2013-11-07 MED ORDER — LEVOTHYROXINE SODIUM 175 MCG PO TABS: 175.0000 ug | ORAL_TABLET | Freq: Every day | ORAL | Status: DC

## 2013-12-14 ENCOUNTER — Ambulatory Visit (INDEPENDENT_AMBULATORY_CARE_PROVIDER_SITE_OTHER): Payer: 59 | Admitting: Family Medicine

## 2013-12-14 ENCOUNTER — Encounter: Payer: Self-pay | Admitting: Family Medicine

## 2013-12-14 VITALS — BP 122/81 | HR 86 | Temp 98.3°F | Resp 18 | Ht 68.0 in | Wt 259.0 lb

## 2013-12-14 DIAGNOSIS — D72829 Elevated white blood cell count, unspecified: Secondary | ICD-10-CM

## 2013-12-14 DIAGNOSIS — F909 Attention-deficit hyperactivity disorder, unspecified type: Secondary | ICD-10-CM

## 2013-12-14 DIAGNOSIS — E039 Hypothyroidism, unspecified: Secondary | ICD-10-CM

## 2013-12-14 LAB — CBC WITH DIFFERENTIAL/PLATELET
Basophils Absolute: 0 10*3/uL (ref 0.0–0.1)
HCT: 41.4 % (ref 36.0–46.0)
Hemoglobin: 14.2 g/dL (ref 12.0–15.0)
Lymphs Abs: 3.3 10*3/uL (ref 0.7–4.0)
MCHC: 34.3 g/dL (ref 30.0–36.0)
MCV: 82.6 fL (ref 78.0–100.0)
Monocytes Absolute: 0.6 10*3/uL (ref 0.1–1.0)
Monocytes Relative: 6 % (ref 3–12)
Neutro Abs: 5.4 10*3/uL (ref 1.7–7.7)
Neutrophils Relative %: 57 % (ref 43–77)
RBC: 5.01 MIL/uL (ref 3.87–5.11)
RDW: 13.2 % (ref 11.5–15.5)

## 2013-12-14 MED ORDER — AMPHETAMINE-DEXTROAMPHETAMINE 30 MG PO TABS: 30.0000 mg | ORAL_TABLET | Freq: Every day | ORAL | Status: DC

## 2013-12-14 NOTE — Assessment & Plan Note (Signed)
Increase to 30mg  bid. Rx's for Jan, Feb, and Mar given today with appropriate fill on or after dating on them. Controlled substance contract reviewed with patient today.  Patient signed this and it will be placed in the chart.   UDS done today (she took her adderall today).

## 2013-12-14 NOTE — Assessment & Plan Note (Signed)
Lab Results  Component Value Date   WBC 10.6* 07/05/2013   HGB 13.4 07/05/2013   HCT 39.5 07/05/2013   MCV 83.5 07/05/2013   PLT 399 07/05/2013   Will recheck CBC today.

## 2013-12-14 NOTE — Assessment & Plan Note (Signed)
TSH's have been low, have been down-titrating synthroid, due for TSH recheck today.

## 2013-12-14 NOTE — Progress Notes (Signed)
Pre visit review using our clinic review tool, if applicable. No additional management support is needed unless otherwise documented below in the visit note. 

## 2013-12-14 NOTE — Progress Notes (Signed)
OFFICE NOTE  12/14/2013  CC:  Chief Complaint  Patient presents with  . Medication Refill     HPI: Patient is a 37 y.o. Caucasian female who is here for 4 mo f/u adult ADD and hypothyroidism. Adderall 20mg  bid (8AM and 2PM).  Says med not helping as much as it used to and not lasting as long as it used to.  No side effects from this med.    Extraneous life issues making stress level rise lately, sleep impaired. Quit job recently, has been having family problems, had her house foreclosed on.  No exercise since daylight savings.   From a diet standpoint she feels like she is doing what she needs to do.   Pertinent PMH:  Past Medical History  Diagnosis Date  . Adult ADHD   . Anxiety   . Hypothyroidism   . Depression   . Insomnia   . Ectopic pregnancy, tubal 1999    left fallopian tube resected  . History of ankle fracture 07/2011    left (nondisplaced fx of tip of lat malleolus  . Impaired glucose tolerance   . History of gestational diabetes mellitus 2010   Past surgical, social, and family history reviewed and no changes noted since last office visit.  MEDS:  Adderall 20mg  bid  PE: Blood pressure 122/81, pulse 86, temperature 98.3 F (36.8 C), temperature source Temporal, resp. rate 18, height 5\' 8"  (1.727 m), weight 259 lb (117.482 kg), SpO2 99.00%. Wt Readings from Last 2 Encounters:  12/14/13 259 lb (117.482 kg)  08/04/13 251 lb 8 oz (114.08 kg)    Gen: alert, oriented x 4, affect pleasant.  Lucid thinking and conversation noted. HEENT: PERRLA, EOMI.   Neck: no LAD, mass, or thyromegaly. CV: RRR, no m/r/g LUNGS: CTA bilat, nonlabored. NEURO: no tremor or tics noted on observation.  Coordination intact. CN 2-12 grossly intact bilaterally, strength 5/5 in all extremeties.  No ataxia.   IMPRESSION AND PLAN:  Adult ADHD Increase to 30mg  bid. Rx's for Jan, Feb, and Mar given today with appropriate fill on or after dating on them. Controlled substance  contract reviewed with patient today.  Patient signed this and it will be placed in the chart.   UDS done today (she took her adderall today).   Hypothyroidism TSH's have been low, have been down-titrating synthroid, due for TSH recheck today.  Leukocytosis, unspecified Lab Results  Component Value Date   WBC 10.6* 07/05/2013   HGB 13.4 07/05/2013   HCT 39.5 07/05/2013   MCV 83.5 07/05/2013   PLT 399 07/05/2013   Will recheck CBC today.   An After Visit Summary was printed and given to the patient.  FOLLOW UP: 4 mo

## 2013-12-15 ENCOUNTER — Other Ambulatory Visit: Payer: Self-pay | Admitting: Family Medicine

## 2013-12-15 LAB — TSH: TSH: 4.806 u[IU]/mL — ABNORMAL HIGH (ref 0.350–4.500)

## 2013-12-15 MED ORDER — LEVOTHYROXINE SODIUM 175 MCG PO TABS: ORAL_TABLET | ORAL | Status: DC

## 2014-01-05 ENCOUNTER — Telehealth: Payer: Self-pay | Admitting: Family Medicine

## 2014-01-05 NOTE — Telephone Encounter (Signed)
Patient's pharmacy called stating that the last Rx for adderall had a sig 1 daily but quanity of 60.  She has been taking 1 tab BID.  Please advise.

## 2014-01-05 NOTE — Telephone Encounter (Signed)
Lattie Haw, CMA, called pharmacy and notified them that she takes this bid.

## 2014-01-19 ENCOUNTER — Encounter: Payer: Self-pay | Admitting: Family Medicine

## 2014-01-20 ENCOUNTER — Telehealth: Payer: Self-pay | Admitting: Family Medicine

## 2014-01-20 MED ORDER — AMOXICILLIN 875 MG PO TABS: 875.0000 mg | ORAL_TABLET | Freq: Two times a day (BID) | ORAL | Status: DC

## 2014-01-20 NOTE — Telephone Encounter (Signed)
Pt sent mychart message describing sinusitis, has no insurance, asked for abx. I authorized this but she knows this is not the routine way to address this problem.

## 2014-01-23 ENCOUNTER — Encounter: Payer: Self-pay | Admitting: Family Medicine

## 2014-01-30 ENCOUNTER — Ambulatory Visit (INDEPENDENT_AMBULATORY_CARE_PROVIDER_SITE_OTHER): Payer: Self-pay | Admitting: Family Medicine

## 2014-01-30 ENCOUNTER — Encounter: Payer: Self-pay | Admitting: Family Medicine

## 2014-01-30 VITALS — BP 115/81 | HR 87 | Temp 98.3°F | Resp 18 | Ht 68.0 in | Wt 267.0 lb

## 2014-01-30 DIAGNOSIS — N390 Urinary tract infection, site not specified: Secondary | ICD-10-CM

## 2014-01-30 DIAGNOSIS — N200 Calculus of kidney: Secondary | ICD-10-CM

## 2014-01-30 DIAGNOSIS — R339 Retention of urine, unspecified: Secondary | ICD-10-CM

## 2014-01-30 DIAGNOSIS — R109 Unspecified abdominal pain: Secondary | ICD-10-CM

## 2014-01-30 LAB — POCT URINALYSIS DIPSTICK
Glucose, UA: 100
Nitrite, UA: POSITIVE
PH UA: 5
PROTEIN UA: 100
SPEC GRAV UA: 1.02
UROBILINOGEN UA: 4

## 2014-01-30 MED ORDER — HYDROCODONE-ACETAMINOPHEN 5-325 MG PO TABS: ORAL_TABLET | ORAL | Status: DC

## 2014-01-30 MED ORDER — LEVOFLOXACIN 500 MG PO TABS: 500.0000 mg | ORAL_TABLET | Freq: Every day | ORAL | Status: DC

## 2014-01-30 NOTE — Progress Notes (Signed)
Pre visit review using our clinic review tool, if applicable. No additional management support is needed unless otherwise documented below in the visit note. 

## 2014-01-30 NOTE — Progress Notes (Signed)
OFFICE NOTE  01/30/2014  CC:  Chief Complaint  Patient presents with  . Back Pain    x Yesterday  . Urinary Retention    azo has helped      HPI: Patient is a 38 y.o. Caucasian female who is here for 24 hour hx of right lower back pain (severe), radiating to right side/flank and into groin intermittently.  Pressure in bladder region, has difficulty urinating due to the pain (but no dysuria/urethral burning). Some urgency w/out ability to empty bladder is noted, and this is relieved by AZO otc pill every couple of hours. Subjective fever/chills has been felt.  A little nausea when the pain was very severe.  No vomiting. No vag d/c or bleeding.  No diarrhea.  Last BM 2 d/a. Has been on amoxil x 10d for sinusitis and these sx's have not improved.  Still with same resp sx's--face pressure, upper teeth pain, greenish/blood tinged mucous, HA.  Has had sx's about 1 mo now. Sudafed helps, mucinex D doesn't.  Minimal cough from PND, but denies chest tightness/wheezing/SOB. LMP: 4 yrs ago b/c she has IUD  Pertinent PMH:  Past medical, surgical, social, and family history reviewed and no changes are noted since last office visit.  MEDS:  Outpatient Prescriptions Prior to Visit  Medication Sig Dispense Refill  . amoxicillin (AMOXIL) 875 MG tablet Take 1 tablet (875 mg total) by mouth 2 (two) times daily.  20 tablet  0  . amphetamine-dextroamphetamine (ADDERALL) 30 MG tablet Take 1 tablet (30 mg total) by mouth daily.  60 tablet  0  . Benzoyl Peroxide 10 % LOTN Apply 1-2 times per day to affected areas for acne  30 mL  6  . levonorgestrel (MIRENA) 20 MCG/24HR IUD 1 each by Intrauterine route once.      Marland Kitchen levothyroxine (SYNTHROID, LEVOTHROID) 175 MCG tablet 1 tab po qd on Monday through Saturday, and take 1 and 1/2 tabs on sundays  32 tablet  4   No facility-administered medications prior to visit.    PE: Blood pressure 115/81, pulse 87, temperature 98.3 F (36.8 C), temperature source  Temporal, resp. rate 18, height 5\' 8"  (1.727 m), weight 267 lb (121.11 kg), SpO2 100.00%. VS: noted--normal. Gen: alert, NAD, NONTOXIC APPEARING. HEENT: eyes without injection, drainage, or swelling.  Ears: EACs clear, TMs with normal light reflex and landmarks.  Nose: Clear rhinorrhea, with some dried, crusty exudate adherent to mildly injected mucosa.  No purulent d/c.  No paranasal sinus TTP.  No facial swelling.  Throat and mouth without focal lesion.  No pharyngial swelling, erythema, or exudate.   Neck: supple, no LAD.   LUNGS: CTA bilat, nonlabored resps.   CV: RRR, no m/r/g. BACK: no CVA or low back tenderness.  Skin w/out rash. Sides/flanks w/out tenderness.  Mild right groin and suprapubic discomfort.  NO mass.  No rebound tenderness or guarding.  BS normal.  No HSM or mass.   LAB: CC UA today globally positive except for glucose (pt on AZO)  IMPRESSION AND PLAN:  1) Acute, intermittent right LB/flank/groin pain most consistent with kidney stone/ureterolithiasis. Some symptoms c/w urinary infection as well. UA uninterpretable due to AZO use. Send urine for c/s. Start levaquin 500mg  qd x 10d. Vicodin 5/325, 1-2 q6h prn pain, #20, no RF.  2) Persistent sinusitis, no improvement with 10d course of amoxil. Encouraged nasal saline irrigation. Hopefully levaquin will help this problem.  FOLLOW UP:  Call or return if not markedly improved in 48h.

## 2014-02-01 ENCOUNTER — Encounter: Payer: Self-pay | Admitting: Family Medicine

## 2014-02-01 LAB — URINE CULTURE
Colony Count: NO GROWTH
Organism ID, Bacteria: NO GROWTH

## 2014-03-16 ENCOUNTER — Other Ambulatory Visit: Payer: Self-pay | Admitting: Family Medicine

## 2014-03-16 ENCOUNTER — Encounter: Payer: Self-pay | Admitting: Family Medicine

## 2014-03-16 MED ORDER — AMPHETAMINE-DEXTROAMPHETAMINE 30 MG PO TABS: 30.0000 mg | ORAL_TABLET | Freq: Two times a day (BID) | ORAL | Status: DC

## 2014-03-16 NOTE — Telephone Encounter (Signed)
Spoke with Customer service manager at [facility].  Last Rx was for # 60 and filled on 03/11/14.  Please advise.

## 2014-03-16 NOTE — Telephone Encounter (Signed)
Advised patient that the refills will be mailed to her per her request and that she would need to schedule a follow up in June sometime before needing her next refill.

## 2014-03-16 NOTE — Telephone Encounter (Signed)
I printed rx's dated to fill on the appropriate date: one for this month and one for May.  Since I did see her in Feb, I am ok with her scheduling her next adult ADD med f/u visit for sometime in June--thx.

## 2014-03-16 NOTE — Telephone Encounter (Signed)
Patient sent mychart message requesting adderall refill.  Patient last OV was 01/30/14 for acute.  Patient was told to return for 4 month for appointment on 12/14/13.   Patient last refill states to fill on or after 02/28/14.  Please advise appointment or refill when due on 03/30/14.

## 2014-06-05 ENCOUNTER — Other Ambulatory Visit: Payer: Self-pay | Admitting: Family Medicine

## 2014-06-05 MED ORDER — AMPHETAMINE-DEXTROAMPHETAMINE 30 MG PO TABS: 30.0000 mg | ORAL_TABLET | Freq: Two times a day (BID) | ORAL | Status: DC

## 2014-06-27 ENCOUNTER — Other Ambulatory Visit: Payer: Self-pay | Admitting: Family Medicine

## 2014-06-27 ENCOUNTER — Encounter: Payer: Self-pay | Admitting: Family Medicine

## 2014-06-27 MED ORDER — LEVOTHYROXINE SODIUM 175 MCG PO TABS
ORAL_TABLET | ORAL | Status: DC
Start: 2014-06-27 — End: 2015-01-31

## 2014-06-28 ENCOUNTER — Encounter: Payer: Self-pay | Admitting: Family Medicine

## 2014-06-28 MED ORDER — VALACYCLOVIR HCL 1 G PO TABS: ORAL_TABLET | ORAL | Status: AC

## 2014-06-28 NOTE — Telephone Encounter (Signed)
Valtrex rx sent to pharmacy.

## 2014-07-13 ENCOUNTER — Ambulatory Visit: Payer: Self-pay | Admitting: Family Medicine

## 2014-07-14 ENCOUNTER — Encounter: Payer: Self-pay | Admitting: Family Medicine

## 2014-07-14 ENCOUNTER — Ambulatory Visit (INDEPENDENT_AMBULATORY_CARE_PROVIDER_SITE_OTHER): Payer: BC Managed Care – PPO | Admitting: Family Medicine

## 2014-07-14 VITALS — BP 121/83 | HR 89 | Temp 98.5°F | Resp 18 | Ht 68.0 in | Wt 249.0 lb

## 2014-07-14 DIAGNOSIS — F411 Generalized anxiety disorder: Secondary | ICD-10-CM

## 2014-07-14 DIAGNOSIS — F419 Anxiety disorder, unspecified: Secondary | ICD-10-CM

## 2014-07-14 DIAGNOSIS — E039 Hypothyroidism, unspecified: Secondary | ICD-10-CM

## 2014-07-14 MED ORDER — AMPHETAMINE-DEXTROAMPHETAMINE 30 MG PO TABS: 30.0000 mg | ORAL_TABLET | Freq: Two times a day (BID) | ORAL | Status: DC

## 2014-07-14 NOTE — Progress Notes (Signed)
OFFICE NOTE  07/14/2014  CC:  Chief Complaint  Patient presents with  . Medication Refill   HPI: Patient is a 38 y.o. Caucasian female who is here for 5 mo f/u adult ADD and hypothyroidism. Hasn't taken her synthr in 3 wks-social and financial problems lately, splitting with husband again. She has still been on her adderall 30 mg bid.  She is not fasting today. Says she can feel low thyroid sx's: fatigued, achy, emotional, lethargic attitude.   Pertinent PMH:  Past surgical, social, and family history reviewed and no changes noted since last office visit.  MEDS:  Not on levaquin listed below. Current synthroid dose is 175 qd, except Sunday when she takes 1 and 1/2 tabs. Outpatient Prescriptions Prior to Visit  Medication Sig Dispense Refill  . amphetamine-dextroamphetamine (ADDERALL) 30 MG tablet Take 1 tablet (30 mg total) by mouth 2 (two) times daily.  60 tablet  0  . levonorgestrel (MIRENA) 20 MCG/24HR IUD 1 each by Intrauterine route once.      Marland Kitchen levothyroxine (SYNTHROID, LEVOTHROID) 175 MCG tablet 1 tab po qd on Monday through Saturday, and take 1 and 1/2 tabs on sundays  32 tablet  3  . valACYclovir (VALTREX) 1000 MG tablet 2 tabs po q12h x 2 doses for cold sores  4 tablet  3  . HYDROcodone-acetaminophen (NORCO/VICODIN) 5-325 MG per tablet 1-2 tabs po q6h prn pain  20 tablet  0  . levofloxacin (LEVAQUIN) 500 MG tablet Take 1 tablet (500 mg total) by mouth daily.  10 tablet  0  . SYNTHROID 175 MCG tablet TAKE 1 TABLET BY MOUTH ON MONDAY THROUGH SATURDAY AND TAKE 1 AND 1/2 TABLET BY MOUTH SUNDAYS  32 tablet  4  . Benzoyl Peroxide 10 % LOTN Apply 1-2 times per day to affected areas for acne  30 mL  6   No facility-administered medications prior to visit.    PE: Blood pressure 121/83, pulse 89, temperature 98.5 F (36.9 C), temperature source Temporal, resp. rate 18, height 5\' 8"  (1.727 m), weight 249 lb (112.946 kg), SpO2 98.00%. Wt Readings from Last 2 Encounters:  07/14/14  249 lb (112.946 kg)  01/30/14 267 lb (121.11 kg)    Gen: alert, oriented x 4, affect pleasant.  Lucid thinking and conversation noted. HEENT: PERRLA, EOMI.   Neck: no LAD, mass, or thyromegaly. CV: RRR, no m/r/g LUNGS: CTA bilat, nonlabored. NEURO: no tremor or tics noted on observation.  Coordination intact. CN 2-12 grossly intact bilaterally, strength 5/5 in all extremeties.  No ataxia.   LAB:  Lab Results  Component Value Date   TSH 4.806* 12/14/2013   IMPRESSION AND PLAN:  1) Adult ADD: The current medical regimen is effective;  continue present plan and medications. I printed rx's for adderall 30mg  bid, #60- today for this month, August, and September 2015.  Appropriate fill on/after date was noted on each rx.  2) Hypothyroidism: noncompliant with med and TSH monitoring. She'll restart her synthroid (brand name only) at 175 mcg qd except 1 and 1/2 of the 175 mcg tabs on sundays. Recheck TSH in our lab in 6-8 wks.  An After Visit Summary was printed and given to the patient.  FOLLOW UP: 4 mo

## 2014-07-14 NOTE — Progress Notes (Signed)
Pre visit review using our clinic review tool, if applicable. No additional management support is needed unless otherwise documented below in the visit note. 

## 2014-11-29 ENCOUNTER — Other Ambulatory Visit: Payer: Self-pay | Admitting: Family Medicine

## 2014-11-29 MED ORDER — AMPHETAMINE-DEXTROAMPHETAMINE 30 MG PO TABS: 30.0000 mg | ORAL_TABLET | Freq: Two times a day (BID) | ORAL | Status: DC

## 2015-01-31 ENCOUNTER — Ambulatory Visit (INDEPENDENT_AMBULATORY_CARE_PROVIDER_SITE_OTHER): Payer: BLUE CROSS/BLUE SHIELD | Admitting: Family Medicine

## 2015-01-31 ENCOUNTER — Encounter: Payer: Self-pay | Admitting: Family Medicine

## 2015-01-31 ENCOUNTER — Ambulatory Visit
Admission: RE | Admit: 2015-01-31 | Discharge: 2015-01-31 | Disposition: A | Discharge status: Home / Self Care | Payer: BLUE CROSS/BLUE SHIELD | Source: Ambulatory Visit | Attending: Family Medicine | Admitting: Family Medicine

## 2015-01-31 VITALS — BP 130/82 | HR 91 | Temp 98.6°F | Ht 68.0 in | Wt 264.0 lb

## 2015-01-31 DIAGNOSIS — F909 Attention-deficit hyperactivity disorder, unspecified type: Secondary | ICD-10-CM

## 2015-01-31 DIAGNOSIS — M7581 Other shoulder lesions, right shoulder: Secondary | ICD-10-CM

## 2015-01-31 DIAGNOSIS — Z23 Encounter for immunization: Secondary | ICD-10-CM

## 2015-01-31 DIAGNOSIS — F900 Attention-deficit hyperactivity disorder, predominantly inattentive type: Secondary | ICD-10-CM

## 2015-01-31 DIAGNOSIS — M25511 Pain in right shoulder: Secondary | ICD-10-CM

## 2015-01-31 DIAGNOSIS — E039 Hypothyroidism, unspecified: Secondary | ICD-10-CM

## 2015-01-31 LAB — TSH: TSH: 0.48 u[IU]/mL (ref 0.35–4.50)

## 2015-01-31 MED ORDER — AMPHETAMINE-DEXTROAMPHETAMINE 30 MG PO TABS: 30.0000 mg | ORAL_TABLET | Freq: Two times a day (BID) | ORAL | Status: DC

## 2015-01-31 MED ORDER — METHYLPREDNISOLONE ACETATE 40 MG/ML IJ SUSP
40.0000 mg | Freq: Once | INTRAMUSCULAR | Status: AC
  Administered 2015-01-31: 40 mg via INTRA_ARTICULAR

## 2015-01-31 MED ORDER — LEVOTHYROXINE SODIUM 175 MCG PO TABS: ORAL_TABLET | ORAL | Status: DC

## 2015-01-31 NOTE — Progress Notes (Signed)
OFFICE NOTE  01/31/2015  CC: f/u ADD and hypothy  HPI: Patient is a 39 y.o. Caucasian female who is here for 7 mo f/u Adult ADD, hypothyroidism. Due to financial problems she went w/out synthroid for a few months, then got back on it daily for about the last 4 wks.  Has gained wt over this time.  No exercise. Has still been on 30mg  bid "plain" adderall daily.  Working full time for [employer].  Right shoulder pain last couple months on/off, points to anterior shoulder region, hurts with aBduction and when reaching out or picking up things.  Initially began when throwing football and she heard a pop.  No radicular sx's, no paresthesias down right arm.   Pertinent PMH:  Past medical, surgical, social, and family history reviewed and no changes are noted since last office visit.  MEDS:  Outpatient Prescriptions Prior to Visit  Medication Sig Dispense Refill  . amphetamine-dextroamphetamine (ADDERALL) 30 MG tablet Take 1 tablet by mouth 2 (two) times daily. 60 tablet 0  . Benzoyl Peroxide 10 % LOTN Apply 1-2 times per day to affected areas for acne 30 mL 6  . levonorgestrel (MIRENA) 20 MCG/24HR IUD 1 each by Intrauterine route once.    Marland Kitchen levothyroxine (SYNTHROID, LEVOTHROID) 175 MCG tablet 1 tab po qd on Monday through Saturday, and take 1 and 1/2 tabs on sundays 32 tablet 3  . valACYclovir (VALTREX) 1000 MG tablet 2 tabs po q12h x 2 doses for cold sores 4 tablet 3   No facility-administered medications prior to visit.    PE: Blood pressure 130/82, pulse 91, temperature 98.6 F (37 C), temperature source Temporal, height 5\' 8"  (1.727 m), weight 264 lb (119.75 kg), SpO2 100 %. Wt Readings from Last 2 Encounters:  01/31/15 264 lb (119.75 kg)  07/14/14 249 lb (112.946 kg)    Gen: alert, oriented x 4, affect pleasant.  Lucid thinking and conversation noted. HEENT: PERRLA, EOMI.   Neck: no LAD, mass, or thyromegaly. CV: RRR, no m/r/g LUNGS: CTA bilat, nonlabored. NEURO: no tremor  or tics noted on observation.  Coordination intact. CN 2-12 grossly intact bilaterally, strength 5/5 in all extremeties.  No ataxia. Right shoulder with no gross abnormality on inspection.  TTP over right AC joint and somewhat TTP around hook of acromion. NO neck or trapezius MM tenderness.  Neck with full ROM. Right shoulder ROM intact except she can only abduct to about 90 deg due to pain in right should with this.  Neg drop sign. +Scarf sign.  +RC impingement signs.  +pain with resisted right shoulder IR and ER.  IMPRESSION AND PLAN:  1) Hypothyroidism; noncompliant with med due to money prob's. Back on med now (as per dose in med list above) for 60mo--recheck TSH today to see where we are.  2) Adult ADHD; The current medical regimen is effective;  continue present plan and medications. I printed rx's for adderall 30mg , 1 bid, #60 today for this month, March 2016, and April 2016.  Appropriate fill on/after date was noted on each rx.  3) Right shoulder pain; suspect most of this is due to RC tendonitis but she may have some AC joint arthritis. Check right shoulder x-ray.   Pt was agreeable to subacromial injection of steroid today.  Procedure: Therapeutic right shoulder injection.  The patient's clinical condition is marked by substantial pain and/or significant functional disability.  Other conservative therapy has not provided relief, is contraindicated, or not appropriate.  There is a reasonable  likelihood that injection will significantly improve the patient's pain and/or functional disability. Cleaned skin with alcohol swab, used posterolateral approach, Injected 1  ml of 40mg /ml depo-medrol + 39ml of 1% lidocaine w/out epi into subacromial space without resistance.  No immediate complications.  Patient tolerated procedure well.  Post-injection care discussed, including 20 min of icing 1-2 times in the next 4-8 hours, frequent non weight-bearing ROM exercises over the next few days, and  general pain medication management.  Flu vaccine IM today.  An After Visit Summary was printed and given to the patient.  FOLLOW UP: 3-4 mo for fasting CPE

## 2015-01-31 NOTE — Progress Notes (Signed)
Pre visit review using our clinic review tool, if applicable. No additional management support is needed unless otherwise documented below in the visit note. 

## 2015-05-24 ENCOUNTER — Encounter: Payer: Self-pay | Admitting: Family Medicine

## 2015-05-24 ENCOUNTER — Other Ambulatory Visit: Payer: Self-pay | Admitting: Family Medicine

## 2015-05-24 MED ORDER — AMPHETAMINE-DEXTROAMPHETAMINE 30 MG PO TABS: 30.0000 mg | ORAL_TABLET | Freq: Two times a day (BID) | ORAL | Status: DC

## 2015-05-24 NOTE — Telephone Encounter (Signed)
Pt requesting refill for Adderall. LOV 2/17/146, no up coming ov, last written: 01/31/15 #60 w/ 0RF.

## 2015-05-28 ENCOUNTER — Other Ambulatory Visit: Payer: Self-pay | Admitting: *Deleted

## 2015-05-28 MED ORDER — LEVOTHYROXINE SODIUM 175 MCG PO TABS: ORAL_TABLET | ORAL | Status: DC

## 2015-05-28 NOTE — Telephone Encounter (Signed)
Fax from [facility] requesting refill for Synthroid. LOV 01/31/15, no up coming ov, last written: 01/31/15 #32 w 3RF. Last TSH 0.48 on 01/31/15. Rx sent for #32 w/ #RF.

## 2015-07-29 ENCOUNTER — Encounter: Payer: Self-pay | Admitting: Family Medicine

## 2015-07-30 ENCOUNTER — Ambulatory Visit: Payer: Self-pay | Admitting: Family Medicine

## 2015-08-23 ENCOUNTER — Ambulatory Visit (INDEPENDENT_AMBULATORY_CARE_PROVIDER_SITE_OTHER): Payer: Managed Care, Other (non HMO) | Admitting: Family Medicine

## 2015-08-23 ENCOUNTER — Encounter: Payer: Self-pay | Admitting: Family Medicine

## 2015-08-23 VITALS — BP 138/80 | HR 83 | Temp 98.4°F | Resp 16 | Ht 68.0 in | Wt 277.0 lb

## 2015-08-23 DIAGNOSIS — R5382 Chronic fatigue, unspecified: Secondary | ICD-10-CM | POA: Diagnosis not present

## 2015-08-23 DIAGNOSIS — R7301 Impaired fasting glucose: Secondary | ICD-10-CM | POA: Diagnosis not present

## 2015-08-23 DIAGNOSIS — F900 Attention-deficit hyperactivity disorder, predominantly inattentive type: Secondary | ICD-10-CM | POA: Diagnosis not present

## 2015-08-23 DIAGNOSIS — F909 Attention-deficit hyperactivity disorder, unspecified type: Secondary | ICD-10-CM

## 2015-08-23 DIAGNOSIS — G4733 Obstructive sleep apnea (adult) (pediatric): Secondary | ICD-10-CM

## 2015-08-23 DIAGNOSIS — E039 Hypothyroidism, unspecified: Secondary | ICD-10-CM

## 2015-08-23 DIAGNOSIS — G4710 Hypersomnia, unspecified: Secondary | ICD-10-CM

## 2015-08-23 LAB — COMPREHENSIVE METABOLIC PANEL
ALBUMIN: 3.9 g/dL (ref 3.5–5.2)
ALT: 11 U/L (ref 0–35)
AST: 12 U/L (ref 0–37)
Alkaline Phosphatase: 87 U/L (ref 39–117)
BILIRUBIN TOTAL: 0.4 mg/dL (ref 0.2–1.2)
BUN: 16 mg/dL (ref 6–23)
CHLORIDE: 103 meq/L (ref 96–112)
CO2: 30 mEq/L (ref 19–32)
CREATININE: 0.89 mg/dL (ref 0.40–1.20)
Calcium: 9.1 mg/dL (ref 8.4–10.5)
GFR: 74.83 mL/min (ref >60.00)
Glucose, Bld: 97 mg/dL (ref 70–99)
Potassium: 4.1 mEq/L (ref 3.5–5.1)
SODIUM: 139 meq/L (ref 135–145)
TOTAL PROTEIN: 6.7 g/dL (ref 6.0–8.3)

## 2015-08-23 LAB — CBC WITH DIFFERENTIAL/PLATELET
Basophils Absolute: 0 10*3/uL (ref 0.0–0.1)
Basophils Relative: 0.3 % (ref 0.0–3.0)
EOS ABS: 0.2 10*3/uL (ref 0.0–0.7)
EOS PCT: 1.5 % (ref 0.0–5.0)
HCT: 41.1 % (ref 36.0–46.0)
Hemoglobin: 13.6 g/dL (ref 12.0–15.0)
LYMPHS ABS: 3.6 10*3/uL (ref 0.7–4.0)
Lymphocytes Relative: 31.3 % (ref 12.0–46.0)
MCHC: 33.2 g/dL (ref 30.0–36.0)
MCV: 87.3 fl (ref 78.0–100.0)
MONO ABS: 0.6 10*3/uL (ref 0.1–1.0)
Monocytes Relative: 4.9 % (ref 3.0–12.0)
NEUTROS PCT: 62 % (ref 43.0–77.0)
Neutro Abs: 7.2 10*3/uL (ref 1.4–7.7)
Platelets: 438 10*3/uL — ABNORMAL HIGH (ref 150.0–400.0)
RBC: 4.71 Mil/uL (ref 3.87–5.11)
RDW: 12.8 % (ref 11.5–15.5)
WBC: 11.6 10*3/uL — ABNORMAL HIGH (ref 4.0–10.5)

## 2015-08-23 LAB — HEMOGLOBIN A1C: Hgb A1c MFr Bld: 5.4 % (ref 4.6–6.5)

## 2015-08-23 LAB — TSH: TSH: 34.94 u[IU]/mL — AB (ref 0.35–4.50)

## 2015-08-23 MED ORDER — LISDEXAMFETAMINE DIMESYLATE 60 MG PO CAPS: 60.0000 mg | ORAL_CAPSULE | ORAL | Status: DC

## 2015-08-23 NOTE — Progress Notes (Signed)
OFFICE VISIT  08/23/2015   CC:  Chief Complaint  Patient presents with  . Follow-up    Pt is not fasting.   HPI:    Patient is a 39 y.o. Caucasian female who presents for 6 mo f/u adult ADHD and hypothyroidism. Has new job in [place] at [employer]. Says she feels like adderall doesn't help anymore with focus/concentration, energy. Feels chronically fatigued.  Gets 6-7 hours of sleep per night.  She exercises sporadically, diets sporadically. Last thyroid dose was 2 weeks --ran out.   Prior to that she had taken it daily for about 2 months.  Describes excessive daytime sleepiness, poor energy, hx of snoring.  Also question of remote hx of + home sleep study "years ago".   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 Denies depressed mood.  ROS: polydipsia but no polyuria.  No muscle aches, no joint aches or swelling.  No oral ulcers, no rashes.  Past Medical History  Diagnosis Date  . Adult ADHD   . Anxiety   . Hypothyroidism   . Depression   . Insomnia   . Ectopic pregnancy, tubal 1999    left fallopian tube resected  . History of ankle fracture 07/2011    left (nondisplaced fx of tip of lat malleolus  . Impaired glucose tolerance   . History of gestational diabetes mellitus 2010  . Nephrolithiasis approx 2008    Passed stone on her own    Past Surgical History  Procedure Laterality Date  . Cesarean section    . Laparoscopic cholecystectomy  early 2000s  . Ablation      thyroid  . Ectopic  1999    left fallopian tube resected for ectopic preg     Outpatient Prescriptions Prior to Visit  Medication Sig Dispense Refill  . Ibuprofen (MOTRIN IB PO) Take by mouth as needed.    Marland Kitchen levonorgestrel (MIRENA) 20 MCG/24HR IUD 1 each by Intrauterine route once.    Marland Kitchen levothyroxine (SYNTHROID, LEVOTHROID) 175 MCG tablet 1 tab po qd on Monday through Saturday, and take 1 and 1/2 tabs on sundays 32 tablet 3  .  valACYclovir (VALTREX) 1000 MG tablet 2 tabs po q12h x 2 doses for cold sores 4 tablet 3  . amphetamine-dextroamphetamine (ADDERALL) 30 MG tablet Take 1 tablet by mouth 2 (two) times daily. 60 tablet 0  . Benzoyl Peroxide 10 % LOTN Apply 1-2 times per day to affected areas for acne (Patient not taking: Reported on 08/23/2015) 30 mL 6   No facility-administered medications prior to visit.    No Known Allergies  ROS As per HPI  PE: Blood pressure 138/80, pulse 83, temperature 98.4 F (36.9 C), temperature source Other (Comment), resp. rate 16, height 5\' 8"  (1.727 m), weight 277 lb (125.646 kg), SpO2 100 %. Wt Readings from Last 2 Encounters:  08/23/15 277 lb (125.646 kg)  01/31/15 264 lb (119.75 kg)    Gen: alert, oriented x 4, affect pleasant.  Lucid thinking and conversation noted. HEENT: PERRLA, EOMI.   Neck: no LAD, mass, or thyromegaly. CV: RRR, no m/r/g LUNGS: CTA bilat, nonlabored. NEURO: no tremor or tics noted on observation.  Coordination intact. CN 2-12 grossly intact bilaterally, strength 5/5 in all extremeties.  No ataxia.  LABS:  Lab Results  Component Value Date   HGBA1C 5.6 04/07/2013    Lab Results  Component Value Date   TSH 0.48 01/31/2015  Lab Results  Component Value Date   WBC 9.5 12/14/2013   HGB 14.2 12/14/2013   HCT 41.4 12/14/2013   MCV 82.6 12/14/2013   PLT 416* 12/14/2013   Lab Results  Component Value Date   CREATININE 0.71 04/07/2013   BUN 13 04/07/2013   NA 137 04/07/2013   K 4.3 04/07/2013   CL 104 04/07/2013   CO2 24 04/07/2013   Lab Results  Component Value Date   ALT 12 04/07/2013   AST 12 04/07/2013   ALKPHOS 113 04/07/2013   BILITOT 0.3 04/07/2013   Lab Results  Component Value Date   CHOL 152 04/07/2013   Lab Results  Component Value Date   HDL 53 04/07/2013   Lab Results  Component Value Date   LDLCALC 81 04/07/2013   Lab Results  Component Value Date   TRIG 90 04/07/2013   Lab Results  Component Value  Date   CHOLHDL 2.9 04/07/2013   IMPRESSION AND PLAN:  1) Adult ADHD: tolerance to adderall. Would likely get better response and longer duration from vyvanse so we switched to this today, 60mg  qd. Therapeutic expectations and side effect profile of medication discussed today.  Patient's questions answered.  2) Hypothyroidism: pt noncompliant with meds historically. Ran out 2 weeks ago.  Check TSH today.  3) Chronic fatigue, with excessive daytime somnolence and remote history of home sleep study positive for OSA. She never got on CPAP, though, b/c she says she lost weight and snoring went away.  However, now she has gained her wt back. Refer to pulm for consideration of repeat sleep study.  4) Hx of IFG/gest DM.  She has ? Of polydipsia: will check CMET with HbA1c today. Needs FLP when she is fasting--possibly next visit.  An After Visit Summary was printed and given to the patient.  FOLLOW UP: Return in about 4 weeks (around 09/20/2015) for f/u ADHD and fatigue and hypoth.

## 2015-08-23 NOTE — Progress Notes (Signed)
Pre visit review using our clinic review tool, if applicable. No additional management support is needed unless otherwise documented below in the visit note. 

## 2015-08-24 ENCOUNTER — Other Ambulatory Visit: Payer: Self-pay | Admitting: Family Medicine

## 2015-08-24 ENCOUNTER — Encounter: Payer: Self-pay | Admitting: Family Medicine

## 2015-08-24 MED ORDER — METHYLPHENIDATE HCL ER (OSM) 54 MG PO TBCR: 54.0000 mg | EXTENDED_RELEASE_TABLET | ORAL | Status: AC

## 2015-08-24 MED ORDER — LEVOTHYROXINE SODIUM 175 MCG PO TABS: ORAL_TABLET | ORAL | Status: AC

## 2015-10-22 ENCOUNTER — Institutional Professional Consult (permissible substitution): Payer: Managed Care, Other (non HMO) | Admitting: Pulmonary Disease

## 2016-05-30 IMAGING — CR DG SHOULDER 2+V*R*
4 series · 4 of 4 positions shown · non-contrast
Comparison: None.

CLINICAL DATA: Right shoulder pain after throwing football 4 months
ago. Steroid shots.

EXAM:
RIGHT SHOULDER - 2+ VIEW

[view not recorded (1 of 4)]
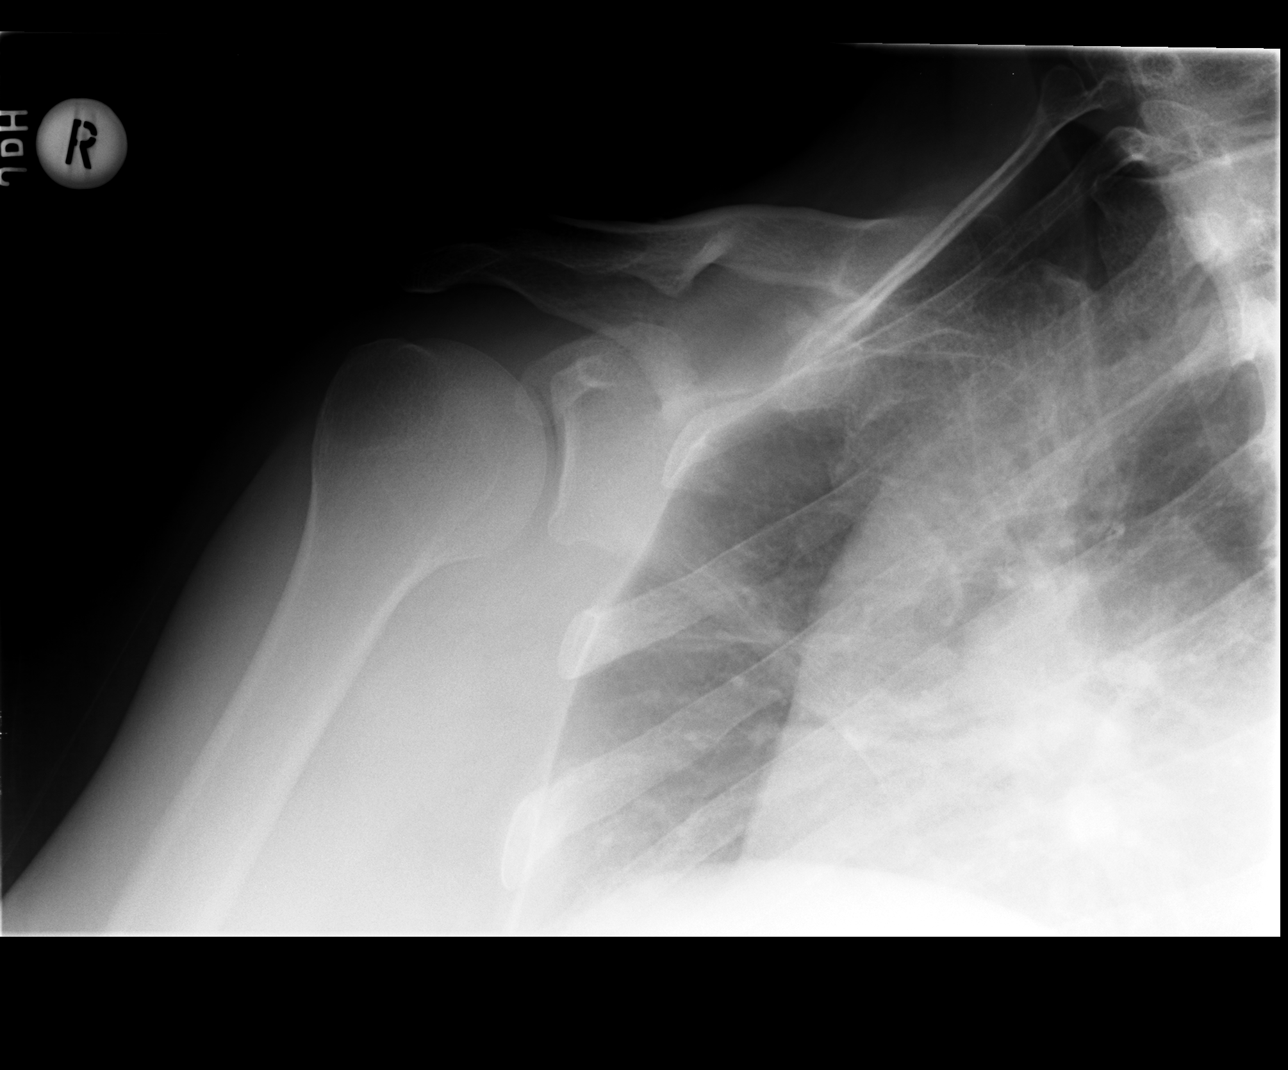

[view not recorded (2 of 4)]
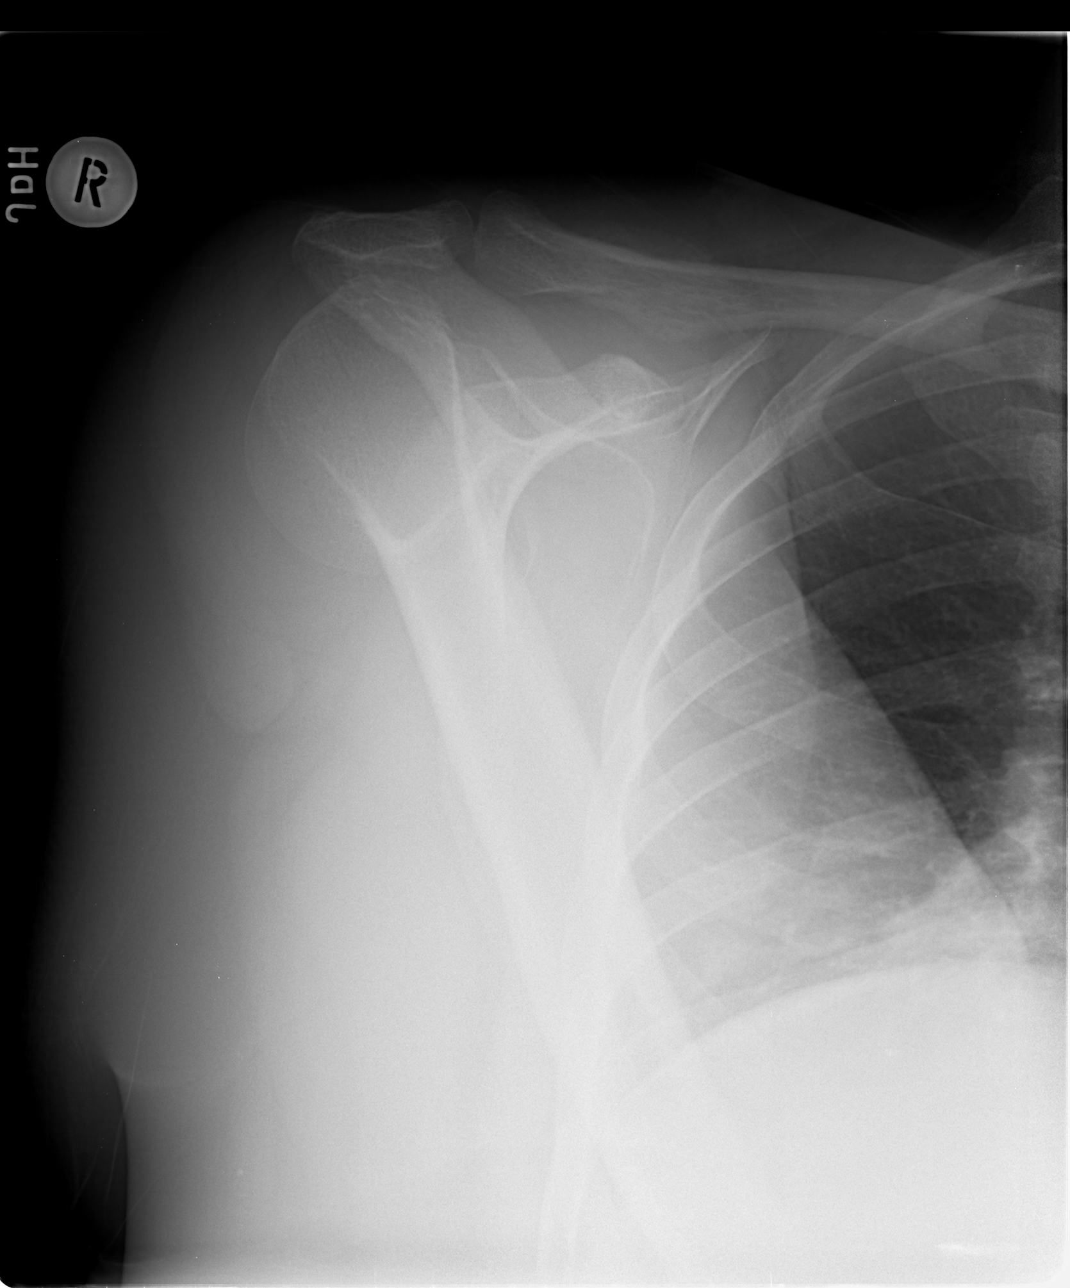

[view not recorded (3 of 4)]
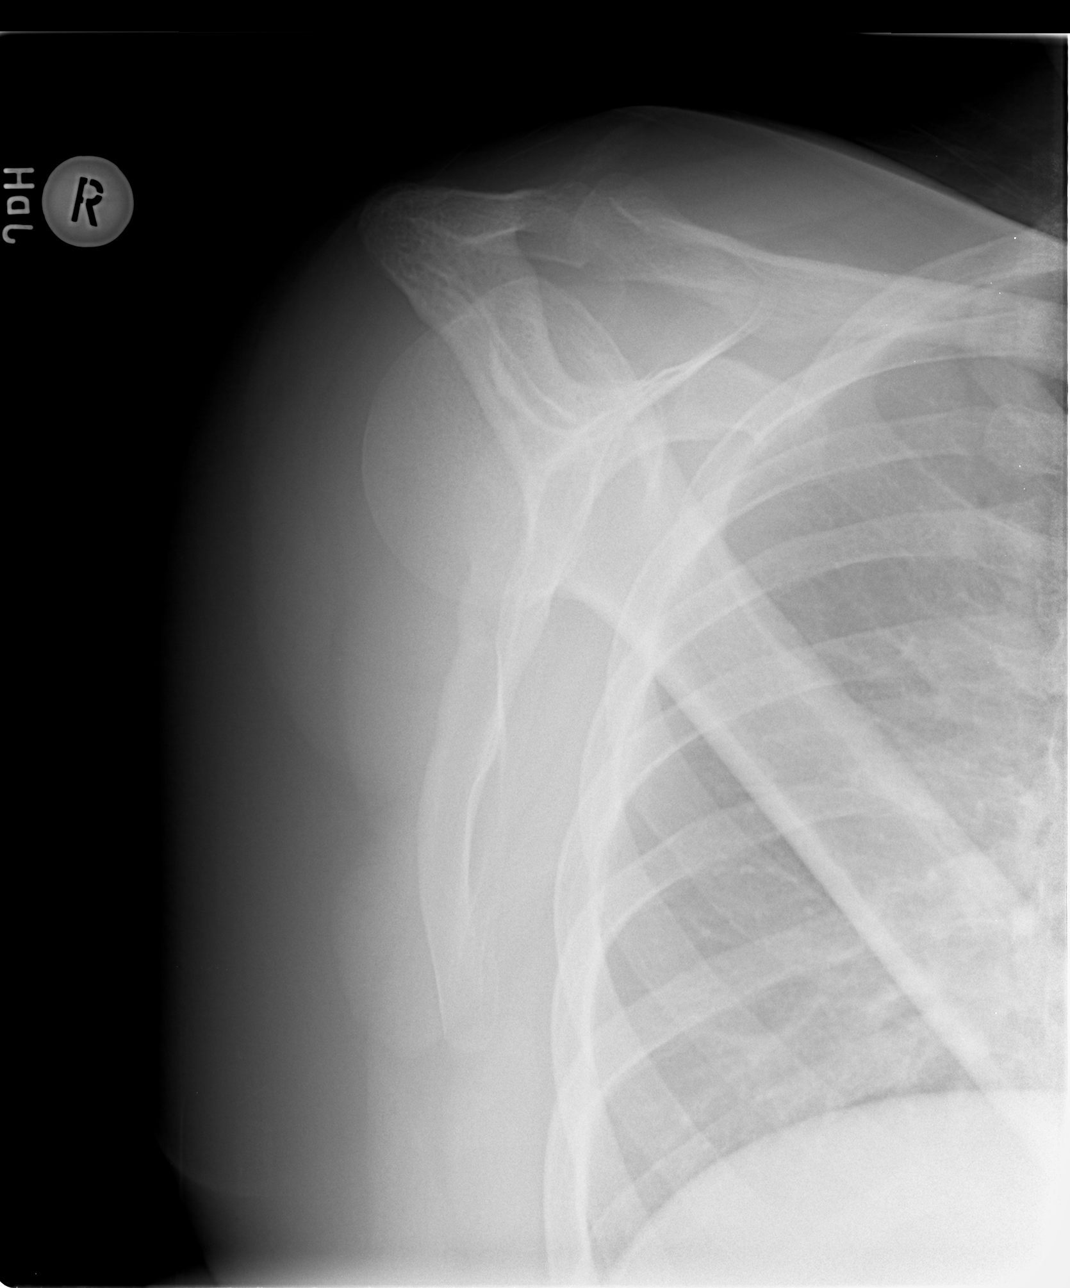

[view not recorded (4 of 4)]
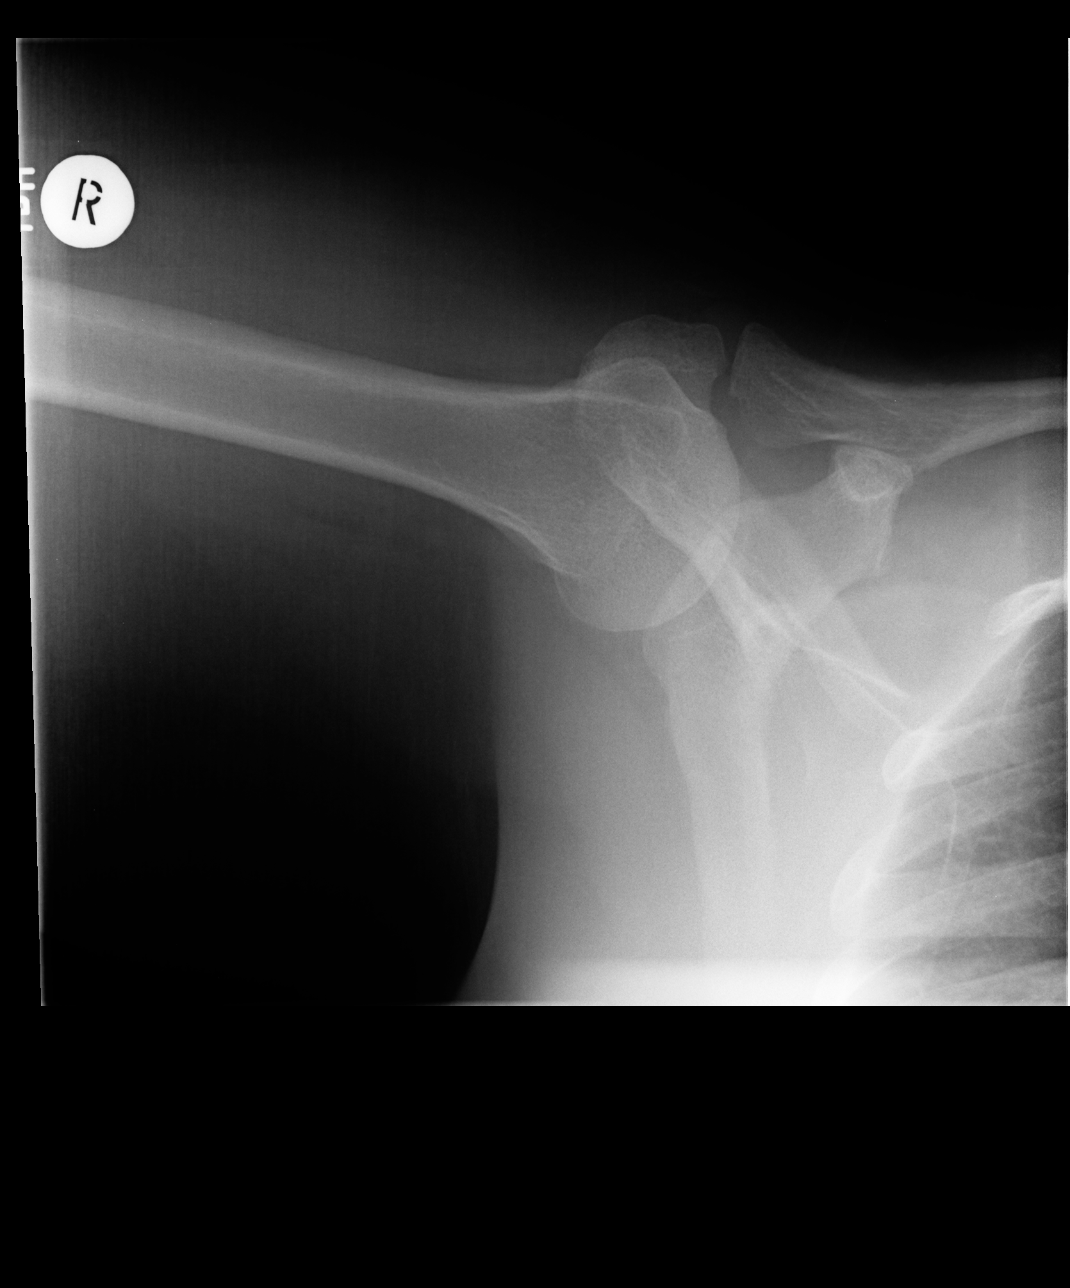

[4 of 4 positions shown; findings below may reference images not displayed]

FINDINGS: There is no evidence of fracture or dislocation. There is no
evidence of arthropathy or other focal bone abnormality. Soft
tissues are unremarkable.
IMPRESSION: Negative.

## 2024-05-04 ENCOUNTER — Other Ambulatory Visit: Payer: Self-pay | Admitting: Medical Genetics

## 2024-05-05 ENCOUNTER — Other Ambulatory Visit (HOSPITAL_COMMUNITY): Payer: Self-pay
